# Patient Record
Sex: Female | Born: 1990 | Race: Black or African American | Hispanic: No | Marital: Single | Smoking: Never smoker
Health system: Southern US, Community
[De-identification: ages and names within clinical notes are randomized; demographics above are authoritative.]

## PROBLEM LIST (undated history)

## (undated) DIAGNOSIS — F419 Anxiety disorder, unspecified: Secondary | ICD-10-CM

## (undated) DIAGNOSIS — G43909 Migraine, unspecified, not intractable, without status migrainosus: Secondary | ICD-10-CM

## (undated) HISTORY — DX: Anxiety disorder, unspecified: F41.9

## (undated) HISTORY — DX: Migraine, unspecified, not intractable, without status migrainosus: G43.909

---
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
## 2011-03-07 ENCOUNTER — Emergency Department (HOSPITAL_COMMUNITY): Payer: No Typology Code available for payment source

## 2011-03-07 ENCOUNTER — Emergency Department (HOSPITAL_COMMUNITY)
Admission: EM | Admit: 2011-03-07 | Discharge: 2011-03-07 | Disposition: A | Discharge status: Home / Self Care | Payer: No Typology Code available for payment source | Attending: Emergency Medicine | Admitting: Emergency Medicine

## 2011-03-07 DIAGNOSIS — S93409A Sprain of unspecified ligament of unspecified ankle, initial encounter: Secondary | ICD-10-CM | POA: Insufficient documentation

## 2011-03-07 DIAGNOSIS — X58XXXA Exposure to other specified factors, initial encounter: Secondary | ICD-10-CM | POA: Insufficient documentation

## 2011-03-07 DIAGNOSIS — M549 Dorsalgia, unspecified: Secondary | ICD-10-CM | POA: Insufficient documentation

## 2011-03-07 DIAGNOSIS — Y9367 Activity, basketball: Secondary | ICD-10-CM | POA: Insufficient documentation

## 2011-04-07 LAB — D-DIMER, QUANTITATIVE: D-Dimer, Quant: 0.37 ug/mL-FEU (ref 0.00–0.48)

## 2011-08-18 ENCOUNTER — Other Ambulatory Visit: Payer: Self-pay | Admitting: Internal Medicine

## 2011-08-18 DIAGNOSIS — N6311 Unspecified lump in the right breast, upper outer quadrant: Secondary | ICD-10-CM

## 2011-08-20 ENCOUNTER — Ambulatory Visit
Admission: RE | Admit: 2011-08-20 | Discharge: 2011-08-20 | Disposition: A | Discharge status: Home / Self Care | Payer: BC Managed Care – PPO | Source: Ambulatory Visit | Attending: Internal Medicine | Admitting: Internal Medicine

## 2011-08-20 ENCOUNTER — Other Ambulatory Visit: Payer: Self-pay

## 2011-08-20 DIAGNOSIS — N6311 Unspecified lump in the right breast, upper outer quadrant: Secondary | ICD-10-CM

## 2011-08-21 ENCOUNTER — Ambulatory Visit
Admission: RE | Admit: 2011-08-21 | Discharge: 2011-08-21 | Disposition: A | Discharge status: Home / Self Care | Payer: BC Managed Care – PPO | Source: Ambulatory Visit | Attending: Internal Medicine | Admitting: Internal Medicine

## 2011-08-21 ENCOUNTER — Other Ambulatory Visit: Payer: Self-pay | Admitting: Internal Medicine

## 2011-08-21 DIAGNOSIS — R2230 Localized swelling, mass and lump, unspecified upper limb: Secondary | ICD-10-CM

## 2011-08-26 ENCOUNTER — Ambulatory Visit
Admission: RE | Admit: 2011-08-26 | Discharge: 2011-08-26 | Disposition: A | Discharge status: Home / Self Care | Payer: BC Managed Care – PPO | Source: Ambulatory Visit | Attending: Internal Medicine | Admitting: Internal Medicine

## 2011-08-26 DIAGNOSIS — R2230 Localized swelling, mass and lump, unspecified upper limb: Secondary | ICD-10-CM

## 2013-02-09 ENCOUNTER — Emergency Department (HOSPITAL_COMMUNITY)
Admission: EM | Admit: 2013-02-09 | Discharge: 2013-02-09 | Disposition: A | Discharge status: Home / Self Care | Payer: BC Managed Care – PPO | Attending: Emergency Medicine | Admitting: Emergency Medicine

## 2013-02-09 ENCOUNTER — Encounter (HOSPITAL_COMMUNITY): Payer: Self-pay | Admitting: *Deleted

## 2013-02-09 DIAGNOSIS — Z79899 Other long term (current) drug therapy: Secondary | ICD-10-CM | POA: Insufficient documentation

## 2013-02-09 DIAGNOSIS — Z7982 Long term (current) use of aspirin: Secondary | ICD-10-CM | POA: Insufficient documentation

## 2013-02-09 DIAGNOSIS — J029 Acute pharyngitis, unspecified: Secondary | ICD-10-CM | POA: Insufficient documentation

## 2013-02-09 DIAGNOSIS — R1310 Dysphagia, unspecified: Secondary | ICD-10-CM | POA: Insufficient documentation

## 2013-02-09 MED ORDER — DEXAMETHASONE SODIUM PHOSPHATE 4 MG/ML IJ SOLN: 8.0000 mg | Freq: Once | INTRAMUSCULAR | Status: DC

## 2013-02-09 MED ORDER — PENICILLIN G BENZATHINE 1200000 UNIT/2ML IM SUSP
1.2000 10*6.[IU] | Freq: Once | INTRAMUSCULAR | Status: AC
  Administered 2013-02-09: 1.2 10*6.[IU] via INTRAMUSCULAR
  Filled 2013-02-09: qty 2

## 2013-02-09 MED ORDER — DEXAMETHASONE SODIUM PHOSPHATE 10 MG/ML IJ SOLN
8.0000 mg | Freq: Once | INTRAMUSCULAR | Status: AC
  Administered 2013-02-09: 8 mg via INTRAMUSCULAR
  Filled 2013-02-09: qty 1

## 2013-02-09 MED ORDER — HYDROCODONE-ACETAMINOPHEN 7.5-500 MG/15ML PO SOLN: ORAL | Status: DC

## 2013-02-09 NOTE — ED Provider Notes (Signed)
History     CSN: [id number]  Arrival date & time 02/09/13  1511   First MD Initiated Contact with Patient 02/09/13 1537      Chief Complaint  Patient presents with  . Sore Throat  . Back Pain    (Consider location/radiation/quality/duration/timing/severity/associated sxs/prior treatment) HPI  Patient reports 4 days ago she started getting diffuse body aches and then started getting a sore throat. She states she feels like her tonsils are swollen. She states she's having difficulty swallowing and she feels like her airway is constricted. She denies any fever. She denies being around anybody else who is ill. Patient also states she has her typical migraine headache that is in the frontal area and is a pressure feeling. She states that headache is improving. She denies nausea, vomiting, blurred vision. She states she does have some mild light sensitivity. 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  PCP Dr. Kirtland Bouchard. [place]  History reviewed. No pertinent past medical history.  No past surgical history on file.  No family history on file.  History  Substance Use Topics  . Smoking status: no  . Smokeless tobacco: Not on file  . Alcohol Use: No  college student  OB History   Grav Para Term Preterm Abortions TAB SAB Ect Mult Living                  Review of Systems  All other systems reviewed and are negative.    Allergies  Review of patient's allergies indicates no known allergies.  Home Medications   Current Outpatient Rx  Name  Route  Sig  Dispense  Refill  . aspirin 325 MG tablet   Oral   Take 325 mg by mouth daily.         . cholecalciferol (VITAMIN D) 1000 UNITS tablet   Oral   Take 1,000 Units by mouth daily.         . ferrous gluconate (FERGON) 324 MG tablet   Oral   Take 324 mg by mouth daily with breakfast.         . ibuprofen (ADVIL,MOTRIN) 200 MG tablet   Oral   Take 400 mg by mouth every 6 (six) hours as needed for pain.            BP 142/75  Pulse 91  Temp(Src) 99.1 F (37.3 C) (Oral)  Resp 20  SpO2 100%  Vital signs normal    Physical Exam  Nursing note and vitals reviewed. Constitutional: She is oriented to person, place, and time. She appears well-developed and well-nourished.  Non-toxic appearance. She does not appear ill. No distress.  Coughing at times  HENT:  Head: Normocephalic and atraumatic.  Right Ear: External ear normal.  Left Ear: External ear normal.  Nose: Nose normal. No mucosal edema or rhinorrhea.  Mouth/Throat: Oropharynx is clear and moist and mucous membranes are normal. No dental abscesses or edematous.    Normal voice, has redenned tonsils bilaterally with purlent material. No soft tissue swelling, not "kissing tonsils". Pt handling her secretions, not spitting in a cup.   Eyes: Conjunctivae and EOM are normal. Pupils are equal, round, and reactive to light.  Neck: Normal range of motion and full passive range of motion without pain. Neck supple.  No supraclavicular or epitrochlear lymph nodes  Cardiovascular: Normal rate, regular rhythm and normal heart sounds.  Exam reveals no gallop and no friction rub.   No murmur heard. Pulmonary/Chest: Effort normal and breath sounds normal.  No respiratory distress. She has no wheezes. She has no rhonchi. She has no rales. She exhibits no tenderness and no crepitus.  Abdominal: Normal appearance.  Musculoskeletal: Normal range of motion. She exhibits no edema and no tenderness.  Moves all extremities well.   Lymphadenopathy:    She has no cervical adenopathy.  Neurological: She is alert and oriented to person, place, and time. She has normal strength. No cranial nerve deficit.  Skin: Skin is warm, dry and intact. No rash noted. No erythema. No pallor.  Psychiatric: She has a normal mood and affect. Her speech is normal and behavior is normal. Her mood appears not anxious.    ED Course  Procedures (including critical care  time)  Medications  penicillin g benzathine (BICILLIN LA) 1200000 UNIT/2ML injection 1.2 Million Units (1.2 Million Units Intramuscular Given 02/09/13 1724)  dexamethasone (DECADRON) injection 8 mg (8 mg Intramuscular Given 02/09/13 1725)    Patient not tested for mono because she has no lymphadenopathy. I feel on her exam she does have strep although the rapid strep test is negative.   Results for orders placed during the hospital encounter of 02/09/13  RAPID STREP SCREEN      Result Value Range   Streptococcus, Group A Screen (Direct) NEGATIVE  NEGATIVE      1. Sore throat     Plan discharge  Devoria Albe, MD, FACEP   MDM          Ward Givens, MD 02/09/13 [phone]

## 2013-02-09 NOTE — ED Notes (Signed)
Pt escorted to discharge window. Pt verbalized understanding discharge instructions. In no acute distress.  

## 2013-02-09 NOTE — ED Notes (Addendum)
Pt reports sore throat and feeling like "swollen throat" x4 days. Pain 10/10. Pt still has tonsils. Denies cough, n/v/d. Has not taken any medications for sore throat s/s.   Reports back pain, thigh area, achy pain x4 days as well.

## 2013-10-12 ENCOUNTER — Encounter (HOSPITAL_COMMUNITY): Payer: Self-pay | Admitting: Emergency Medicine

## 2013-10-12 ENCOUNTER — Emergency Department (HOSPITAL_COMMUNITY)
Admission: EM | Admit: 2013-10-12 | Discharge: 2013-10-12 | Disposition: A | Discharge status: Home / Self Care | Payer: BC Managed Care – PPO | Source: Home / Self Care | Attending: Family Medicine | Admitting: Family Medicine

## 2013-10-12 ENCOUNTER — Emergency Department (INDEPENDENT_AMBULATORY_CARE_PROVIDER_SITE_OTHER): Payer: BC Managed Care – PPO

## 2013-10-12 DIAGNOSIS — J111 Influenza due to unidentified influenza virus with other respiratory manifestations: Secondary | ICD-10-CM

## 2013-10-12 MED ORDER — DEXTROMETHORPHAN POLISTIREX 30 MG/5ML PO LQCR: 60.0000 mg | ORAL | Status: DC | PRN

## 2013-10-12 MED ORDER — IPRATROPIUM BROMIDE 0.06 % NA SOLN: 2.0000 | Freq: Four times a day (QID) | NASAL | Status: DC

## 2013-10-12 NOTE — ED Provider Notes (Signed)
CSN: [id number]     Arrival date & time 10/12/13  1753 History   First MD Initiated Contact with Patient 10/12/13 1807     Chief Complaint  Patient presents with  . URI   (Consider location/radiation/quality/duration/timing/severity/associated sxs/prior Treatment) Patient is a 22 y.o. female presenting with URI. The history is provided by the patient and a parent.  URI Presenting symptoms: congestion, cough, fever, rhinorrhea and sore throat   Severity:  Moderate Duration:  4 days Progression:  Unchanged Chronicity:  New Associated symptoms: myalgias   Associated symptoms: no wheezing   Risk factors: sick contacts   Risk factors: no recent illness   Risk factors comment:  Has not had flu vaccine this season.   History reviewed. No pertinent past medical history. History reviewed. No pertinent past surgical history. No family history on file. History  Substance Use Topics  . Smoking status: Never Smoker   . Smokeless tobacco: Not on file  . Alcohol Use: No   OB History   Grav Para Term Preterm Abortions TAB SAB Ect Mult Living                 Review of Systems  Constitutional: Positive for fever.  HENT: Positive for congestion, rhinorrhea and sore throat.   Respiratory: Positive for cough. Negative for wheezing.   Gastrointestinal: Negative.   Genitourinary: Negative.   Musculoskeletal: Positive for myalgias.    Allergies  Review of patient's allergies indicates no known allergies.  Home Medications   Current Outpatient Rx  Name  Route  Sig  Dispense  Refill  . aspirin 325 MG tablet   Oral   Take 325 mg by mouth daily.         . cholecalciferol (VITAMIN D) 1000 UNITS tablet   Oral   Take 1,000 Units by mouth daily.         Marland Kitchen dextromethorphan (DELSYM) 30 MG/5ML liquid   Oral   Take 10 mLs (60 mg total) by mouth as needed for cough.   89 mL   1   . ferrous gluconate (FERGON) 324 MG tablet   Oral   Take 324 mg by mouth daily with breakfast.          . HYDROcodone-acetaminophen (LORTAB) 7.5-500 MG/15ML solution      Take 2-3 tsp po (10-15 cc) every 6 hrs for pain   120 mL   0   . ibuprofen (ADVIL,MOTRIN) 200 MG tablet   Oral   Take 400 mg by mouth every 6 (six) hours as needed for pain.         Marland Kitchen ipratropium (ATROVENT) 0.06 % nasal spray   Nasal   Place 2 sprays into the nose 4 (four) times daily.   15 mL   1    BP 136/89  Pulse 101  Temp(Src) 99 F (37.2 C) (Oral)  Resp 16  SpO2 100%  LMP 09/03/2013 Physical Exam  Nursing note and vitals reviewed. Constitutional: She is oriented to person, place, and time. She appears well-developed and well-nourished.  HENT:  Head: Normocephalic.  Right Ear: External ear normal.  Left Ear: External ear normal.  Mouth/Throat: Oropharynx is clear and moist.  Eyes: Conjunctivae are normal. Pupils are equal, round, and reactive to light.  Neck: Normal range of motion. Neck supple.  Cardiovascular: Normal rate, regular rhythm and normal heart sounds.   Pulmonary/Chest: Effort normal and breath sounds normal.  Abdominal: Soft. Bowel sounds are normal. She exhibits no distension and no mass. There  is no tenderness. There is no rebound and no guarding.  Lymphadenopathy:    She has cervical adenopathy.  Neurological: She is alert and oriented to person, place, and time.  Skin: Skin is warm and dry.    ED Course  Procedures (including critical care time) Labs Review Labs Reviewed  POCT RAPID STREP A [facility] URG CARE ONLY)   Imaging Review Dg Chest 2 View  10/12/2013   CLINICAL DATA:  Fever, cough  EXAM: CHEST  2 VIEW  COMPARISON:  February 16, 2009.  FINDINGS: The heart size and mediastinal contours are within normal limits. Both lungs are clear. The visualized skeletal structures are unremarkable. There is scoliosis of spine.  IMPRESSION: No active cardiopulmonary disease.   Electronically Signed   By: Sherian Rein M.D.   On: 10/12/2013 18:38      MDM  Strep neg X-rays  reviewed and report per radiologist.     Linna Hoff, MD 10/12/13 1904

## 2013-10-12 NOTE — ED Notes (Addendum)
Pt c/o cold sxs onset 4 days Sxs include fevers, ST, chills, productive cough, congestin Taking OTC cold meds Denies: abd pain, urinary sxs Alert w/no signs of acute distress.

## 2013-10-15 LAB — CULTURE, GROUP A STREP

## 2014-12-20 ENCOUNTER — Emergency Department (HOSPITAL_COMMUNITY)
Admission: EM | Admit: 2014-12-20 | Discharge: 2014-12-20 | Disposition: A | Discharge status: Home / Self Care | Payer: BC Managed Care – PPO | Attending: Emergency Medicine | Admitting: Emergency Medicine

## 2014-12-20 ENCOUNTER — Emergency Department (HOSPITAL_COMMUNITY): Payer: BC Managed Care – PPO

## 2014-12-20 ENCOUNTER — Encounter (HOSPITAL_COMMUNITY): Payer: Self-pay | Admitting: *Deleted

## 2014-12-20 DIAGNOSIS — W208XXA Other cause of strike by thrown, projected or falling object, initial encounter: Secondary | ICD-10-CM | POA: Diagnosis not present

## 2014-12-20 DIAGNOSIS — Y9389 Activity, other specified: Secondary | ICD-10-CM | POA: Diagnosis not present

## 2014-12-20 DIAGNOSIS — M79642 Pain in left hand: Secondary | ICD-10-CM

## 2014-12-20 DIAGNOSIS — Y9289 Other specified places as the place of occurrence of the external cause: Secondary | ICD-10-CM | POA: Insufficient documentation

## 2014-12-20 DIAGNOSIS — S60222A Contusion of left hand, initial encounter: Secondary | ICD-10-CM | POA: Insufficient documentation

## 2014-12-20 DIAGNOSIS — Z79899 Other long term (current) drug therapy: Secondary | ICD-10-CM | POA: Insufficient documentation

## 2014-12-20 DIAGNOSIS — Z7982 Long term (current) use of aspirin: Secondary | ICD-10-CM | POA: Insufficient documentation

## 2014-12-20 DIAGNOSIS — S6992XA Unspecified injury of left wrist, hand and finger(s), initial encounter: Secondary | ICD-10-CM | POA: Diagnosis present

## 2014-12-20 DIAGNOSIS — Y998 Other external cause status: Secondary | ICD-10-CM | POA: Diagnosis not present

## 2014-12-20 MED ORDER — HYDROCODONE-ACETAMINOPHEN 5-325 MG PO TABS
1.0000 | ORAL_TABLET | Freq: Once | ORAL | Status: AC
  Administered 2014-12-20: 1 via ORAL
  Filled 2014-12-20: qty 1

## 2014-12-20 MED ORDER — IBUPROFEN 800 MG PO TABS: 800.0000 mg | ORAL_TABLET | Freq: Three times a day (TID) | ORAL | Status: DC

## 2014-12-20 MED ORDER — HYDROCODONE-ACETAMINOPHEN 5-325 MG PO TABS: 1.0000 | ORAL_TABLET | ORAL | Status: DC | PRN

## 2014-12-20 NOTE — ED Notes (Signed)
Pt complains of left hand pain. Pt states a TV set weighing ~50lbs tipped over and fell on pt's hand. Pt states the pain is 9/10.

## 2014-12-20 NOTE — ED Provider Notes (Signed)
CSN: [id number]     Arrival date & time 12/20/14  2030 History  This chart was scribed for non-physician practitioner, Elpidio AnisShari Milana Salay, PA-C,working with Suzi RootsKevin E Steinl, MD, by Karle PlumberJennifer Tensley, ED Scribe. This patient was seen in room WTR6/WTR6 and the patient's care was started at 9:17 PM.  Chief Complaint  Patient presents with  . Hand Injury   The history is provided by the patient. No language interpreter was used.    HPI Comments:  Mary C Catalina Simon is a 23 y.o. female who presents to the Emergency Department complaining of a left hand injury she sustained when a large television was dropped on it about one hour ago. She reports the television weighed about 50 pounds and she was helping to lift it from a trunk when it was dropped, catching it between the bottom of the trunk and the bottom of the television. She reports moderate pain to the 3rd and 4th digits and is unable to move them. Denies modifying factors. She has not done anything to treat her symptoms. Denies numbness, tingling, nausea or vomiting.   History reviewed. No pertinent past medical history. History reviewed. No pertinent past surgical history. No family history on file. History  Substance Use Topics  . Smoking status: Never Smoker   . Smokeless tobacco: Not on file  . Alcohol Use: No   OB History    No data available     Review of Systems  Gastrointestinal: Negative for nausea and vomiting.  Musculoskeletal: Positive for arthralgias.  Skin: Negative for wound.  Neurological: Negative for numbness.  All other systems reviewed and are negative.   Allergies  Review of patient's allergies indicates no known allergies.  Home Medications   Prior to Admission medications   Medication Sig Start Date End Date Taking? Authorizing Provider  aspirin 325 MG tablet Take 325 mg by mouth daily.    Historical Provider, MD  cholecalciferol (VITAMIN D) 1000 UNITS tablet Take 1,000 Units by mouth daily.    Historical  Provider, MD  dextromethorphan (DELSYM) 30 MG/5ML liquid Take 10 mLs (60 mg total) by mouth as needed for cough. 10/12/13   Linna HoffJames D Kindl, MD  ferrous gluconate (FERGON) 324 MG tablet Take 324 mg by mouth daily with breakfast.    Historical Provider, MD  HYDROcodone-acetaminophen (LORTAB) 7.5-500 MG/15ML solution Take 2-3 tsp po (10-15 cc) every 6 hrs for pain 02/09/13   Ward GivensIva L Knapp, MD  ibuprofen (ADVIL,MOTRIN) 200 MG tablet Take 400 mg by mouth every 6 (six) hours as needed for pain.    Historical Provider, MD  ipratropium (ATROVENT) 0.06 % nasal spray Place 2 sprays into the nose 4 (four) times daily. 10/12/13   Linna HoffJames D Kindl, MD   Triage Vitals: BP 109/49 mmHg  Pulse 66  Temp(Src) 98.3 F (36.8 C) (Oral)  Resp 18  SpO2 100%  LMP 11/07/2014 Physical Exam  Constitutional: She is oriented to person, place, and time. She appears well-developed and well-nourished.  HENT:  Head: Normocephalic and atraumatic.  Eyes: EOM are normal.  Neck: Normal range of motion.  Cardiovascular: Normal rate.   Pulmonary/Chest: Effort normal.  Musculoskeletal: Normal range of motion. She exhibits tenderness.  Left hand unremarkable on appearance. No significant swelling. No wounds. Tender most to third and fourth digits with limited DIP ROM.  Neurological: She is alert and oriented to person, place, and time.  Skin: Skin is warm and dry.  Psychiatric: She has a normal mood and affect. Her behavior is normal.  Nursing note and vitals reviewed.   ED Course  Procedures (including critical care time) DIAGNOSTIC STUDIES: Oxygen Saturation is 100% on RA, normal by my interpretation.   COORDINATION OF CARE: 9:19 PM- Will X-Ray left hand. Offered pain medication but pt declined because she has not eaten. Will give crackers and check back with pt. Pt verbalizes understanding and agrees to plan.  Medications  HYDROcodone-acetaminophen (NORCO/VICODIN) 5-325 MG per tablet 1 tablet (1 tablet Oral Given 12/20/14  2141)    Labs Review Labs Reviewed - No data to display  Imaging Review Dg Hand Complete Left  12/20/2014   CLINICAL DATA:  Left hand pain and numbness after trauma. Initial encounter.  EXAM: LEFT HAND - COMPLETE 3+ VIEW  COMPARISON:  None.  FINDINGS: There is no evidence of fracture or dislocation. No acute soft tissue findings.  IMPRESSION: Negative.   Electronically Signed   By: Tiburcio PeaJonathan  Watts M.D.   On: 12/20/2014 21:45     EKG Interpretation None      MDM   Final diagnoses:  Hand pain, left    1. Contusion injury, left hand  No fracture on imaging of left hand. Limited flexion/extension left 3rd/4th DIP likely due to pain as opposed to tendon injury. Will refer to hand ortho on as needed basis. Pain management provided.  I personally performed the services described in this documentation, which was scribed in my presence. The recorded information has been reviewed and is accurate.    Arnoldo HookerShari A Rachael Zapanta, PA-C 12/20/14 2154  Suzi RootsKevin E Steinl, MD 12/20/14 2209

## 2014-12-20 NOTE — Discharge Instructions (Signed)
Cryotherapy °Cryotherapy means treatment with cold. Ice or gel packs can be used to reduce both pain and swelling. Ice is the most helpful within the first 24 to 48 hours after an injury or flare-up from overusing a muscle or joint. Sprains, strains, spasms, burning pain, shooting pain, and aches can all be eased with ice. Ice can also be used when recovering from surgery. Ice is effective, has very few side effects, and is safe for most people to use. °PRECAUTIONS  °Ice is not a safe treatment option for people with: °· Raynaud phenomenon. This is a condition affecting small blood vessels in the extremities. Exposure to cold may cause your problems to return. °· Cold hypersensitivity. There are many forms of cold hypersensitivity, including: °¨ Cold urticaria. Red, itchy hives appear on the skin when the tissues begin to warm after being iced. °¨ Cold erythema. This is a red, itchy rash caused by exposure to cold. °¨ Cold hemoglobinuria. Red blood cells break down when the tissues begin to warm after being iced. The hemoglobin that carry oxygen are passed into the urine because they cannot combine with blood proteins fast enough. °· Numbness or altered sensitivity in the area being iced. °If you have any of the following conditions, do not use ice until you have discussed cryotherapy with your caregiver: °· Heart conditions, such as arrhythmia, angina, or chronic heart disease. °· High blood pressure. °· Healing wounds or open skin in the area being iced. °· Current infections. °· Rheumatoid arthritis. °· Poor circulation. °· Diabetes. °Ice slows the blood flow in the region it is applied. This is beneficial when trying to stop inflamed tissues from spreading irritating chemicals to surrounding tissues. However, if you expose your skin to cold temperatures for too long or without the proper protection, you can damage your skin or nerves. Watch for signs of skin damage due to cold. °HOME CARE INSTRUCTIONS °Follow  these tips to use ice and cold packs safely. °· Place a dry or damp towel between the ice and skin. A damp towel will cool the skin more quickly, so you may need to shorten the time that the ice is used. °· For a more rapid response, add gentle compression to the ice. °· Ice for no more than 10 to 20 minutes at a time. The bonier the area you are icing, the less time it will take to get the benefits of ice. °· Check your skin after 5 minutes to make sure there are no signs of a poor response to cold or skin damage. °· Rest 20 minutes or more between uses. °· Once your skin is numb, you can end your treatment. You can test numbness by very lightly touching your skin. The touch should be so light that you do not see the skin dimple from the pressure of your fingertip. When using ice, most people will feel these normal sensations in this order: cold, burning, aching, and numbness. °· Do not use ice on someone who cannot communicate their responses to pain, such as small children or people with dementia. °HOW TO MAKE AN ICE PACK °Ice packs are the most common way to use ice therapy. Other methods include ice massage, ice baths, and cryosprays. Muscle creams that cause a cold, tingly feeling do not offer the same benefits that ice offers and should not be used as a substitute unless recommended by your caregiver. °To make an ice pack, do one of the following: °· Place crushed ice or a   bag of frozen vegetables in a sealable plastic bag. Squeeze out the excess air. Place this bag inside another plastic bag. Slide the bag into a pillowcase or place a damp towel between your skin and the bag. °· Mix 3 parts water with 1 part rubbing alcohol. Freeze the mixture in a sealable plastic bag. When you remove the mixture from the freezer, it will be slushy. Squeeze out the excess air. Place this bag inside another plastic bag. Slide the bag into a pillowcase or place a damp towel between your skin and the bag. °SEEK MEDICAL CARE  IF: °· You develop white spots on your skin. This may give the skin a blotchy (mottled) appearance. °· Your skin turns blue or pale. °· Your skin becomes waxy or hard. °· Your swelling gets worse. °MAKE SURE YOU:  °· Understand these instructions. °· Will watch your condition. °· Will get help right away if you are not doing well or get worse. °Document Released: 08/03/2011 Document Revised: 04/23/2014 Document Reviewed: 08/03/2011 °[employer]® Patient Information ©2015 [employer]. This information is not intended to replace advice given to you by your health care provider. Make sure you discuss any questions you have with your health care provider. ° °

## 2017-10-11 ENCOUNTER — Ambulatory Visit (INDEPENDENT_AMBULATORY_CARE_PROVIDER_SITE_OTHER): Payer: Self-pay | Admitting: Physician Assistant

## 2018-11-16 ENCOUNTER — Emergency Department (HOSPITAL_COMMUNITY): Payer: BLUE CROSS/BLUE SHIELD

## 2018-11-16 ENCOUNTER — Encounter (HOSPITAL_COMMUNITY): Payer: Self-pay | Admitting: Obstetrics and Gynecology

## 2018-11-16 ENCOUNTER — Other Ambulatory Visit: Payer: Self-pay

## 2018-11-16 ENCOUNTER — Emergency Department (HOSPITAL_COMMUNITY)
Admission: EM | Admit: 2018-11-16 | Discharge: 2018-11-16 | Disposition: A | Discharge status: Home / Self Care | Payer: BLUE CROSS/BLUE SHIELD | Attending: Emergency Medicine | Admitting: Emergency Medicine

## 2018-11-16 DIAGNOSIS — R21 Rash and other nonspecific skin eruption: Secondary | ICD-10-CM

## 2018-11-16 DIAGNOSIS — Z79899 Other long term (current) drug therapy: Secondary | ICD-10-CM | POA: Insufficient documentation

## 2018-11-16 DIAGNOSIS — R519 Headache, unspecified: Secondary | ICD-10-CM

## 2018-11-16 DIAGNOSIS — H538 Other visual disturbances: Secondary | ICD-10-CM | POA: Diagnosis not present

## 2018-11-16 DIAGNOSIS — R51 Headache: Secondary | ICD-10-CM | POA: Diagnosis not present

## 2018-11-16 DIAGNOSIS — R0789 Other chest pain: Secondary | ICD-10-CM | POA: Diagnosis not present

## 2018-11-16 DIAGNOSIS — R202 Paresthesia of skin: Secondary | ICD-10-CM | POA: Diagnosis present

## 2018-11-16 LAB — CBC
HCT: 40.9 % (ref 36.0–46.0)
HEMOGLOBIN: 13 g/dL (ref 12.0–15.0)
MCH: 28.4 pg (ref 26.0–34.0)
MCHC: 31.8 g/dL (ref 30.0–36.0)
MCV: 89.3 fL (ref 80.0–100.0)
Platelets: 355 10*3/uL (ref 150–400)
RBC: 4.58 MIL/uL (ref 3.87–5.11)
RDW: 13.9 % (ref 11.5–15.5)
WBC: 8.7 10*3/uL (ref 4.0–10.5)
nRBC: 0 % (ref 0.0–0.2)

## 2018-11-16 LAB — BASIC METABOLIC PANEL
ANION GAP: 7 (ref 5–15)
BUN: 9 mg/dL (ref 6–20)
CO2: 28 mmol/L (ref 22–32)
Calcium: 9.1 mg/dL (ref 8.9–10.3)
Chloride: 102 mmol/L (ref 98–111)
Creatinine, Ser: 0.63 mg/dL (ref 0.44–1.00)
GFR calc Af Amer: >60 mL/min (ref >60)
GLUCOSE: 94 mg/dL (ref 70–99)
POTASSIUM: 3.8 mmol/L (ref 3.5–5.1)
Sodium: 137 mmol/L (ref 135–145)

## 2018-11-16 LAB — I-STAT TROPONIN, ED: TROPONIN I, POC: 0 ng/mL (ref 0.00–0.08)

## 2018-11-16 LAB — I-STAT BETA HCG BLOOD, ED (MC, WL, AP ONLY): I-stat hCG, quantitative: <5 m[IU]/mL (ref <5)

## 2018-11-16 LAB — SEDIMENTATION RATE: SED RATE: 20 mm/h (ref 0–22)

## 2018-11-16 LAB — C-REACTIVE PROTEIN: CRP: 1.4 mg/dL — ABNORMAL HIGH (ref <1.0)

## 2018-11-16 MED ORDER — TETRACAINE HCL 0.5 % OP SOLN
1.0000 [drp] | Freq: Once | OPHTHALMIC | Status: AC
  Administered 2018-11-16: 1 [drp] via OPHTHALMIC
  Filled 2018-11-16: qty 4

## 2018-11-16 MED ORDER — LORAZEPAM 2 MG/ML IJ SOLN
1.0000 mg | Freq: Once | INTRAMUSCULAR | Status: AC
  Administered 2018-11-16: 1 mg via INTRAVENOUS
  Filled 2018-11-16: qty 1

## 2018-11-16 NOTE — ED Triage Notes (Signed)
Pt reports she was recently seen at the ER in [place] for chest pain. Pt reports the chest pain has been going on for approximately 6-8 weeks. Pt reports she had a positive d-dimer but negative CTA.  Pt reports she is having tingling in her left arm and leg.

## 2018-11-16 NOTE — ED Provider Notes (Signed)
[facility]-EMERGENCY DEPT Provider Note   CSN: [id number] Arrival date & time: 11/16/18  1804     History   Chief Complaint Chief Complaint  Patient presents with  . Chest Pain  . Tingling    HPI Davinity C Dettmann is a 27 y.o. female with no pertinent PMH who presents to the emergency department with multiple complaints.  She reports paresthesias and numbness in her bilateral hands and feet that radiates up the arms and legs for the last 6 to 8 weeks.  He also has felt as if her right arm and leg have felt somewhat heavier over the last week.  She also notes intermittent bilateral blurred vision and a right-sided headache that she describes as pressure-like.  States she has been treating her headache with Excedrin Migraine with complete resolution of her symptoms.  She states she has also been feeling pain that she describes as a warm sensation in her chest and on the right side of her neck.  She states the chest pain is worse when she is sitting and somewhat improved with standing.  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  She has a family history of antiphospholipid syndrome and lupus and she also takes estrogen containing OCPs.  A d-dimer was ordered, which was positive, so a PE study was performed, which was negative.  She reports she has since followed up with her primary care provider, Dr. Renae Gloss and was seen by optometry.  She states when she followed up with optometry she was told the pressure in her eye was elevated and she could be at risk for early glaucoma.  She is scheduled to follow-up again in the next few weeks.  She wears both contacts and glasses.  She also states she has developed some "formations" on her left leg.  He states there is an area of swelling on her left lower leg, posterior knee, and to the skin of her left  hip.  She denies fever, chills, loss of vision, diplopia, neck stiffness, back pain, vomiting, diarrhea, constipation, GU symptoms, or URI symptoms.  LMP was 10/21/2017.  The history is provided by the patient and a relative. No language interpreter was used.    History reviewed. No pertinent past medical history.  There are no active problems to display for this patient.   History reviewed. No pertinent surgical history.   OB History    Gravida      Para      Term      Preterm      AB      Living  0     SAB      TAB      Ectopic      Multiple      Live Births               Home Medications    Prior to Admission medications   Medication Sig Start Date End Date Taking? Authorizing Provider  Aspirin-Acetaminophen-Caffeine (EXCEDRIN MIGRAINE PO) Take 1-2 tablets by mouth daily as needed (pain).   Yes [provider]  busPIRone (BUSPAR) 5 MG tablet Take 5 mg by mouth 2 (two) times daily. 10/19/18  Yes [provider]  cholecalciferol (VITAMIN D) 1000 UNITS tablet Take 1,000 Units by mouth daily.   Yes [provider]  ferrous gluconate (FERGON) 324 MG  tablet Take 324 mg by mouth daily with breakfast.   Yes [provider]  naproxen sodium (ALEVE) 220 MG tablet Take 220-440 mg by mouth daily as needed (pain).   Yes [provider]  TRI-PREVIFEM 0.18/0.215/0.25 MG-35 MCG tablet Take 1 tablet by mouth daily. 08/30/18  Yes [provider]    Family History No family history on file.  Social History Social History   Tobacco Use  . Smoking status: Never Smoker  . Smokeless tobacco: Never Used  Substance Use Topics  . Alcohol use: Yes    Comment: Social  . Drug use: No     Allergies   Patient has no known allergies.   Review of Systems Review of Systems  Constitutional: Negative for activity change, chills and fever.  HENT: Negative for congestion, sinus pressure, sinus pain and sneezing.    Respiratory: Positive for shortness of breath. Negative for apnea, cough, wheezing and stridor.   Cardiovascular: Positive for chest pain. Negative for palpitations and leg swelling.  Gastrointestinal: Negative for abdominal pain, diarrhea, nausea and vomiting.  Genitourinary: Positive for menstrual problem. Negative for dysuria, vaginal bleeding and vaginal discharge.  Musculoskeletal: Positive for neck pain. Negative for arthralgias, back pain, gait problem, joint swelling, myalgias and neck stiffness.  Skin: Positive for rash. Negative for wound.  Allergic/Immunologic: Negative for immunocompromised state.  Neurological: Positive for weakness, numbness and headaches. Negative for dizziness, seizures, syncope, speech difficulty and light-headedness.  Hematological: Negative for adenopathy.  Psychiatric/Behavioral: Negative for confusion. The patient is nervous/anxious.    Physical Exam Updated Vital Signs BP 118/71   Pulse 87   Temp 99 F (37.2 C) (Oral)   Resp 20   Ht 5\' 6"  (1.676 m)   Wt 86.2 kg   LMP 10/22/2018 (Approximate)   SpO2 99%   BMI 30.67 kg/m   Physical Exam  Constitutional: She is oriented to person, place, and time. No distress.  Well-appearing.  No acute distress.  HENT:  Head: Normocephalic.  Eyes: Pupils are equal, round, and reactive to light. Conjunctivae and EOM are normal.  Questionably minimal right exophthalmos.  No ptosis.  Extraocular movements are intact.  Neck: Neck supple.  Cardiovascular: Normal rate, regular rhythm, normal heart sounds and intact distal pulses. Exam reveals no gallop and no friction rub.  No murmur heard. Pulmonary/Chest: Effort normal. No stridor. No respiratory distress. She has no wheezes. She has no rales. She exhibits no tenderness.  Abdominal: Soft. She exhibits no distension and no mass. There is no tenderness. There is no rebound and no guarding. No hernia.  Musculoskeletal:  There is a circular area of mild, localized  swelling to the left lateral hip, to the left posterior knee, and to the left lower leg.  No overlying erythema or warmth.  No palpable nodule.  No calf tenderness bilaterally.  Neurological: She is alert and oriented to person, place, and time.  Alert and oriented x4.  No clonus bilaterally.  No cogwheeling.  Finger-to-nose is intact bilaterally.  5 out of 5 strength against resistance of the bilateral upper and lower extremities.  No pronator drift.  Patient reports subjective sensation to light touch is decreased on the right lower extremity as compared to the left and right upper extremity as compared to the left.  Skin: Skin is warm. No rash noted. She is not diaphoretic.  Psychiatric: Her behavior is normal.  Nursing note and vitals reviewed.    ED Treatments / Results  Labs (all labs ordered are listed,  but only abnormal results are displayed) Labs Reviewed  C-REACTIVE PROTEIN - Abnormal; Notable for the following components:      Result Value   CRP 1.4 (*)    All other components within normal limits  BASIC METABOLIC PANEL  CBC  SEDIMENTATION RATE  I-STAT TROPONIN, ED  I-STAT BETA HCG BLOOD, ED ([facility], [facility], [facility] ONLY)    EKG EKG Interpretation  Date/Time:  Wednesday November 16 2018 18:15:35 EST Ventricular Rate:  82 PR Interval:    QRS Duration: 94 QT Interval:  375 QTC Calculation: 438 R Axis:   81 Text Interpretation:  Sinus rhythm Probable left atrial enlargement No significant change since last tracing Confirmed by Linwood Dibbles [phone]) on 11/16/2018 6:57:16 PM   Radiology Dg Chest 2 View  Result Date: 11/16/2018 CLINICAL DATA:  Chest pain for 2 months.  LEFT extremity tingling. EXAM: CHEST - 2 VIEW COMPARISON:  Chest radiograph October 12, 2013 FINDINGS: Cardiomediastinal silhouette is normal. No pleural effusions or focal consolidations. Trachea projects midline and there is no pneumothorax. Soft tissue planes and included osseous structures are non-suspicious.  IMPRESSION: Normal chest. Electronically Signed   By: Awilda Metro M.D.   On: 11/16/2018 18:43   Mr Brain Wo Contrast  Result Date: 11/16/2018 CLINICAL DATA:  Initial evaluation for chronic headache, normal neurologic exam. EXAM: MRI HEAD WITHOUT CONTRAST TECHNIQUE: Multiplanar, multiecho pulse sequences of the brain and surrounding structures were obtained without intravenous contrast. COMPARISON:  None available. FINDINGS: Brain: Cerebral volume within normal limits for patient age. No focal parenchymal signal abnormality identified. No abnormal foci of restricted diffusion to suggest acute or subacute ischemia. Gray-white matter differentiation well maintained. No encephalomalacia to suggest chronic infarction. No foci of susceptibility artifact to suggest acute or chronic intracranial hemorrhage. Mass lesion, midline shift or mass effect. No hydrocephalus. No extra-axial fluid collection. Major dural sinuses are grossly patent. Pituitary gland and suprasellar region are normal. Midline structures intact and normal. Vascular: Major intracranial vascular flow voids well maintained and normal in appearance. Skull and upper cervical spine: Craniocervical junction normal. Visualized upper cervical spine within normal limits. Bone marrow signal intensity normal. No scalp soft tissue abnormality. Sinuses/Orbits: Globes and orbital soft tissues within normal limits. Paranasal sinuses are clear. No mastoid effusion. Inner ear structures normal. Other: None. IMPRESSION: Normal brain MRI.  No acute intracranial abnormality identified. Electronically Signed   By: Rise Mu M.D.   On: 11/16/2018 21:56    Procedures Procedures (including critical care time)  Medications Ordered in ED Medications  LORazepam (ATIVAN) injection 1 mg (1 mg Intravenous Given 11/16/18 2048)  tetracaine (PONTOCAINE) 0.5 % ophthalmic solution 1 drop (1 drop Both Eyes Given by Other 11/16/18 2042)     Initial  Impression / Assessment and Plan / ED Course  I have reviewed the triage vital signs and the nursing notes.  Pertinent labs & imaging results that were available during my care of the patient were reviewed by me and considered in my medical decision making (see chart for details).     27 year old female with no pertinent past medical history presenting with multiple complaints that started approximately 6 to 8 weeks ago including stocking glove distribution of paresthesias and numbness, right-sided neck and chest pain, headaches, blurred vision, and right extremity heaviness.  She recently underwent a negative work-up for PE at the ER in [place].  Her symptoms today are not consistent with PE or DVT.  She will acuity is 20/20 bilaterally, 20/30 on the right, and 20/20  on the left.  She declines IOP testing she is wearing contacts at this time.  EKG unchanged from previous.  Chest x-ray is unremarkable.  Labs are notable for CRP of 1.4, but are otherwise normal.  Given the patient's symptoms and her age and family history of autoimmune disease, will order MRI brain to assess for MS.  MRI brain is unremarkable.  Doubt ACS, PE, DVT, gout, septic joint, septic emboli, MS, or infectious etiology at this time.   At this time, I am uncertain of the etiology of patient's symptoms.  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  I had a lengthy discussion with both the patient and her mother.  I can understand their frustration with the length of time her symptoms have been ongoing without any obvious answers.  I have discussed that some additional tests may be warranted in an outpatient setting, but are not indicated in the ED.  I have strongly recommended the patient follow-up with her PCP, Dr. Renae GlossShelton, as she has been established with her for some time.  The patient was  also requested a referral to rheumatology, which has been provided.  Strict return precautions given.  Patient is hemodynamically stable and in no acute distress.  She is safe for discharge home with outpatient follow-up at this time.  Final Clinical Impressions(s) / ED Diagnoses   Final diagnoses:  Paresthesias  Nonintractable headache, unspecified chronicity pattern, unspecified headache type  Atypical chest pain  Rash and nonspecific skin eruption    ED Discharge Orders    None       Barkley BoardsMcDonald, Vallorie Niccoli A, PA-C 11/17/18 0226    Linwood DibblesKnapp, Jon, MD 11/21/18 1626

## 2018-11-16 NOTE — Discharge Instructions (Signed)
Thank you for allowing me to care for you today in the Emergency Department.   You can continue to use Excedrin Migraine for your headaches as needed.  Use caution with using this medication because it can cause rebound headaches.  Call and schedule follow-up appointment with rheumatology.  Return to the emergency department if you develop respiratory distress, loss of one vision in one or both eyes, double vision, the worst headache of your life, persistent vomiting, or other new, concerning symptoms.

## 2018-11-16 NOTE — ED Notes (Signed)
Patient transported to MRI 

## 2018-11-28 NOTE — Progress Notes (Signed)
Office Visit Note  Patient: Mary Simon             Date of Birth: 12/02/1991           MRN: [id number]             PCP: Andi DevonShelton, Kimberly, MD Referring: Andi DevonShelton, Kimberly, MD Visit Date: 12/02/2018 Occupation: [name], part-time makes [employer]  Subjective:  New Patient (Initial Visit) (Abnormal labs, pain, weakness)   History of Present Illness: Mary Simon is a 27 y.o. female in consultation per request of her PCP.  According to patient her symptoms are started over of 2018 with numbness in her hands.  She states in the last 3 months she has been experiencing increased numbness and tingling in her hands and her feet.  She also has experienced left lower extremity weakness especially in the morning.  She was seen in the emergency room for evaluation and according to patient she had neurological examination which was normal and she was discharged home.  She has been also experiencing increased migraines and pressure behind her eyes.  She states that the numbness and tingling is all the time and sometimes gets more intense.  She is also had episodes of chest pain and stomach pain for which she was seen in the emergency room where the cardiology work-up was negative.  She describes some discomfort behind her knees more prominent behind her left knee joint.  She states that she has very sensitive skin since she feels her skin is painful.  She denies any muscle pain.  She has intermittent discomfort in her neck and lower back.  Activities of Daily Living:  Patient reports morning stiffness for 1 hour.   Patient Reports nocturnal pain.  Difficulty dressing/grooming: Reports Difficulty climbing stairs: Denies Difficulty getting out of chair: Denies Difficulty using hands for taps, buttons, cutlery, and/or writing: Reports  Review of Systems  Constitutional: Positive for fatigue. Negative for night sweats, weight gain and weight loss.  HENT: Positive for  mouth dryness. Negative for mouth sores, trouble swallowing, trouble swallowing and nose dryness.   Eyes: Positive for dryness. Negative for pain, redness and visual disturbance.  Respiratory: Negative for cough, shortness of breath and difficulty breathing.   Cardiovascular: Negative for chest pain, palpitations, hypertension, irregular heartbeat and swelling in legs/feet.  Gastrointestinal: Negative for blood in stool, constipation and diarrhea.  Endocrine: Negative for increased urination.  Genitourinary: Negative for difficulty urinating and vaginal dryness.  Musculoskeletal: Positive for arthralgias, gait problem, joint pain, morning stiffness and muscle tenderness. Negative for joint swelling, myalgias, muscle weakness and myalgias.  Skin: Negative for color change, rash, hair loss, skin tightness, ulcers and sensitivity to sunlight.  Allergic/Immunologic: Negative for susceptible to infections.  Neurological: Positive for numbness and weakness. Negative for dizziness, memory loss and night sweats.  Hematological: Negative for bruising/bleeding tendency and swollen glands.  Psychiatric/Behavioral: Positive for sleep disturbance. Negative for depressed mood. The patient is not nervous/anxious.     PMFS History:  Patient Active Problem List   Diagnosis Date Noted  . Vitamin D deficiency 12/02/2018  . Hx of migraines 12/02/2018    History reviewed. No pertinent past medical history.  Family History  Problem Relation Age of Onset  . Diabetes Mother   . Diabetes Father   . Stroke Father    History reviewed. No pertinent surgical history. Social History   Social History Narrative  . Not on file    Objective: Vital Signs:  BP 121/77 (BP Location: Right Arm, Patient Position: Sitting, Cuff Size: Normal)   Pulse 61   Resp 14   Ht 5' 6.25" (1.683 m)   Wt 198 lb 3.2 oz (89.9 kg)   LMP 11/25/2018   BMI 31.75 kg/m    Physical Exam Vitals signs and nursing note reviewed.    Constitutional:      Appearance: She is well-developed.  HENT:     Head: Normocephalic and atraumatic.  Eyes:     Conjunctiva/sclera: Conjunctivae normal.  Neck:     Musculoskeletal: Normal range of motion.  Cardiovascular:     Rate and Rhythm: Normal rate and regular rhythm.     Heart sounds: Normal heart sounds.  Pulmonary:     Effort: Pulmonary effort is normal.     Breath sounds: Normal breath sounds.  Abdominal:     General: Bowel sounds are normal.     Palpations: Abdomen is soft.  Lymphadenopathy:     Cervical: No cervical adenopathy.  Skin:    General: Skin is warm and dry.     Capillary Refill: Capillary refill takes less than 2 seconds.  Neurological:     Mental Status: She is alert and oriented to person, place, and time.  Psychiatric:        Behavior: Behavior normal.      Musculoskeletal Exam: She has discomfort range of motion of cervical and lumbar spine.  She had good range of motion in her cervical lumbar spine.  Shoulder joints, elbow joints, wrist joints, MCPs PIPs and DIPs been good range of motion with no synovitis.  Hip joints knee joints ankles MTPs PIPs were in good range of motion.  A small popliteal cyst was noted behind the left knee.  Other joints are in good range of motion with no synovitis.  CDAI Exam: CDAI Score: Not documented Patient Global Assessment: Not documented; Provider Global Assessment: Not documented Swollen: Not documented; Tender: Not documented Joint Exam   Not documented   There is currently no information documented on the homunculus. Go to the Rheumatology activity and complete the homunculus joint exam.  Investigation: Findings:  11/16/18: sed rate 20, CRP 1.4  Component     Latest Ref Rng & Units 11/16/2018  Sed Rate     0 - 22 mm/hr 20  CRP     <1.0 mg/dL 1.4 (H)   Imaging: Dg Chest 2 View  Result Date: 11/16/2018 CLINICAL DATA:  Chest pain for 2 months.  LEFT extremity tingling. EXAM: CHEST - 2 VIEW  COMPARISON:  Chest radiograph October 12, 2013 FINDINGS: Cardiomediastinal silhouette is normal. No pleural effusions or focal consolidations. Trachea projects midline and there is no pneumothorax. Soft tissue planes and included osseous structures are non-suspicious. IMPRESSION: Normal chest. Electronically Signed   By: Awilda Metro M.D.   On: 11/16/2018 18:43   Mr Brain Wo Contrast  Result Date: 11/16/2018 CLINICAL DATA:  Initial evaluation for chronic headache, normal neurologic exam. EXAM: MRI HEAD WITHOUT CONTRAST TECHNIQUE: Multiplanar, multiecho pulse sequences of the brain and surrounding structures were obtained without intravenous contrast. COMPARISON:  None available. FINDINGS: Brain: Cerebral volume within normal limits for patient age. No focal parenchymal signal abnormality identified. No abnormal foci of restricted diffusion to suggest acute or subacute ischemia. Gray-white matter differentiation well maintained. No encephalomalacia to suggest chronic infarction. No foci of susceptibility artifact to suggest acute or chronic intracranial hemorrhage. Mass lesion, midline shift or mass effect. No hydrocephalus. No extra-axial fluid collection. Major dural sinuses  are grossly patent. Pituitary gland and suprasellar region are normal. Midline structures intact and normal. Vascular: Major intracranial vascular flow voids well maintained and normal in appearance. Skull and upper cervical spine: Craniocervical junction normal. Visualized upper cervical spine within normal limits. Bone marrow signal intensity normal. No scalp soft tissue abnormality. Sinuses/Orbits: Globes and orbital soft tissues within normal limits. Paranasal sinuses are clear. No mastoid effusion. Inner ear structures normal. Other: None. IMPRESSION: Normal brain MRI.  No acute intracranial abnormality identified. Electronically Signed   By: Rise Mu M.D.   On: 11/16/2018 21:56   Xr Cervical Spine 2 Or 3  Views  Result Date: 12/02/2018 No disc space narrowing was noted.  No facet joint arthropathy was noted. Impression: Unremarkable x-ray of the cervical spine.  Xr Knee 3 View Left  Result Date: 12/02/2018 No patellofemoral narrowing was noted.  No medial lateral compartment narrowing was noted.  No chondrocalcinosis was noted. Impression: Unremarkable x-ray of the knee joint.  Xr Lumbar Spine 2-3 Views  Result Date: 12/02/2018 Lumbar levoscoliosis was noted.  Exaggerated lumbar lordosis was noted.  No significant disc space narrowing was noted. Impression: These findings are consistent with a lumbar levoscoliosis.   Recent Labs: Lab Results  Component Value Date   WBC 8.7 11/16/2018   HGB 13.0 11/16/2018   PLT 355 11/16/2018   NA 137 11/16/2018   K 3.8 11/16/2018   CL 102 11/16/2018   CO2 28 11/16/2018   GLUCOSE 94 11/16/2018   BUN 9 11/16/2018   CREATININE 0.63 11/16/2018   CALCIUM 9.1 11/16/2018   GFRAA >60 11/16/2018    Speciality Comments: No specialty comments available.  Procedures:  No procedures performed Allergies: Patient has no known allergies.   Assessment / Plan:     Visit Diagnoses: Paresthesia -patient complains of paresthesias for the last 1 year.  She describes the paresthesias are getting worse in the last few months in both upper and lower extremities.  Plan: COMPLETE METABOLIC PANEL WITH GFR, B12 and Folate Panel  Neck pain -she has been experiencing neck pain and stiffness.  Plan: XR Cervical Spine 2 or 3 views.  The cervical spine x-rays were unremarkable.  A handout on cervical spine exercises was given.  Chronic midline low back pain without sciatica -she has chronic lower back pain without radiculopathy.  Plan: XR Lumbar Spine 2-3 Views.  The lumbar spine x-ray showed lumbar scoliosis.  I have given her a handout on back exercises.  Chronic pain of left knee -she complains of left lower extremity pain and she has noticed a knot behind her knee  which is a palpable Baker's cyst.  Plan: XR KNEE 3 VIEW LEFT.  The knee joint x-ray was unremarkable.  I have given her handout on knee exercises.  I plan to inject her knee at the next visit.  Synovial cyst of left popliteal space - Plan: Rheumatoid factor, Angiotensin converting enzyme, Cyclic citrul peptide antibody, IgG, ANA  Other fatigue - Plan: Urinalysis, Routine w reflex microscopic, CK, TSH, Serum protein electrophoresis with reflex, Hepatitis B core antibody, IgM, Hepatitis B surface antigen, Hepatitis C antibody, Glucose 6 phosphate dehydrogenase, IgG, IgA, IgM  Hx of migraines-she has had chronic migraines.  She has not seen a neurologist in the past.  The MRI done of her brain recently was within normal limits.  Vitamin D deficiency -she gives history of vitamin D deficiency.  Orders: Orders Placed This Encounter  Procedures  . XR Cervical Spine 2 or 3 views  .  XR Lumbar Spine 2-3 Views  . XR KNEE 3 VIEW LEFT  . COMPLETE METABOLIC PANEL WITH GFR  . Urinalysis, Routine w reflex microscopic  . CK  . TSH  . Rheumatoid factor  . Angiotensin converting enzyme  . Cyclic citrul peptide antibody, IgG  . ANA  . Serum protein electrophoresis with reflex  . Hepatitis B core antibody, IgM  . Hepatitis B surface antigen  . Hepatitis C antibody  . Glucose 6 phosphate dehydrogenase  . IgG, IgA, IgM  . B12 and Folate Panel  . VITAMIN D 25 Hydroxy (Vit-D Deficiency, Fractures)  . Ambulatory referral to Neurology   No orders of the defined types were placed in this encounter.   Face-to-face time spent with patient was 50 minutes. Greater than 50% of time was spent in counseling and coordination of care.  Follow-Up Instructions: Return for Paresthesias.   Pollyann Savoy, MD  Note - This record has been created using Animal nutritionist.  Chart creation errors have been sought, but may not always  have been located. Such creation errors do not reflect on  the standard of  medical care.

## 2018-12-02 ENCOUNTER — Encounter: Payer: Self-pay | Admitting: Rheumatology

## 2018-12-02 ENCOUNTER — Ambulatory Visit (INDEPENDENT_AMBULATORY_CARE_PROVIDER_SITE_OTHER): Payer: Self-pay

## 2018-12-02 ENCOUNTER — Ambulatory Visit: Payer: BLUE CROSS/BLUE SHIELD | Admitting: Rheumatology

## 2018-12-02 VITALS — BP 121/77 | HR 61 | Resp 14 | Ht 66.25 in | Wt 198.2 lb

## 2018-12-02 DIAGNOSIS — M25562 Pain in left knee: Secondary | ICD-10-CM | POA: Diagnosis not present

## 2018-12-02 DIAGNOSIS — Z8669 Personal history of other diseases of the nervous system and sense organs: Secondary | ICD-10-CM

## 2018-12-02 DIAGNOSIS — M542 Cervicalgia: Secondary | ICD-10-CM

## 2018-12-02 DIAGNOSIS — M545 Low back pain, unspecified: Secondary | ICD-10-CM

## 2018-12-02 DIAGNOSIS — G8929 Other chronic pain: Secondary | ICD-10-CM

## 2018-12-02 DIAGNOSIS — E559 Vitamin D deficiency, unspecified: Secondary | ICD-10-CM

## 2018-12-02 DIAGNOSIS — R5383 Other fatigue: Secondary | ICD-10-CM

## 2018-12-02 DIAGNOSIS — R202 Paresthesia of skin: Secondary | ICD-10-CM | POA: Diagnosis not present

## 2018-12-02 DIAGNOSIS — M7122 Synovial cyst of popliteal space [Baker], left knee: Secondary | ICD-10-CM

## 2018-12-02 NOTE — Patient Instructions (Signed)
Knee Exercises Ask your health care provider which exercises are safe for you. Do exercises exactly as told by your health care provider and adjust them as directed. It is normal to feel mild stretching, pulling, tightness, or discomfort as you do these exercises, but you should stop right away if you feel sudden pain or your pain gets worse.Do not begin these exercises until told by your health care provider. STRETCHING AND RANGE OF MOTION EXERCISES These exercises warm up your muscles and joints and improve the movement and flexibility of your knee. These exercises also help to relieve pain, numbness, and tingling. Exercise A: Knee Extension, Prone 1. Lie on your abdomen on a bed. 2. Place your left / right knee just beyond the edge of the surface so your knee is not on the bed. You can put a towel under your left / right thigh just above your knee for comfort. 3. Relax your leg muscles and allow gravity to straighten your knee. You should feel a stretch behind your left / right knee. 4. Hold this position for __________ seconds. 5. Scoot up so your knee is supported between repetitions. Repeat __________ times. Complete this stretch __________ times a day. Exercise B: Knee Flexion, Active  1. Lie on your back with both knees straight. If this causes back discomfort, bend your left / right knee so your foot is flat on the floor. 2. Slowly slide your left / right heel back toward your buttocks until you feel a gentle stretch in the front of your knee or thigh. 3. Hold this position for __________ seconds. 4. Slowly slide your left / right heel back to the starting position. Repeat __________ times. Complete this exercise __________ times a day. Exercise C: Quadriceps, Prone  1. Lie on your abdomen on a firm surface, such as a bed or padded floor. 2. Bend your left / right knee and hold your ankle. If you cannot reach your ankle or pant leg, loop a belt around your foot and grab the belt  instead. 3. Gently pull your heel toward your buttocks. Your knee should not slide out to the side. You should feel a stretch in the front of your thigh and knee. 4. Hold this position for __________ seconds. Repeat __________ times. Complete this stretch __________ times a day. Exercise D: Hamstring, Supine 1. Lie on your back. 2. Loop a belt or towel over the ball of your left / right foot. The ball of your foot is on the walking surface, right under your toes. 3. Straighten your left / right knee and slowly pull on the belt to raise your leg until you feel a gentle stretch behind your knee. ? Do not let your left / right knee bend while you do this. ? Keep your other leg flat on the floor. 4. Hold this position for __________ seconds. Repeat __________ times. Complete this stretch __________ times a day. STRENGTHENING EXERCISES These exercises build strength and endurance in your knee. Endurance is the ability to use your muscles for a long time, even after they get tired. Exercise E: Quadriceps, Isometric  1. Lie on your back with your left / right leg extended and your other knee bent. Put a rolled towel or small pillow under your knee if told by your health care provider. 2. Slowly tense the muscles in the front of your left / right thigh. You should see your kneecap slide up toward your hip or see increased dimpling just above the knee. This   motion will push the back of the knee toward the floor. 3. For __________ seconds, keep the muscle as tight as you can without increasing your pain. 4. Relax the muscles slowly and completely. Repeat __________ times. Complete this exercise __________ times a day. Exercise F: Straight Leg Raises - Quadriceps 1. Lie on your back with your left / right leg extended and your other knee bent. 2. Tense the muscles in the front of your left / right thigh. You should see your kneecap slide up or see increased dimpling just above the knee. Your thigh may  even shake a bit. 3. Keep these muscles tight as you raise your leg 4-6 inches (10-15 cm) off the floor. Do not let your knee bend. 4. Hold this position for __________ seconds. 5. Keep these muscles tense as you lower your leg. 6. Relax your muscles slowly and completely after each repetition. Repeat __________ times. Complete this exercise __________ times a day. Exercise G: Hamstring, Isometric 1. Lie on your back on a firm surface. 2. Bend your left / right knee approximately __________ degrees. 3. Dig your left / right heel into the surface as if you are trying to pull it toward your buttocks. Tighten the muscles in the back of your thighs to dig as hard as you can without increasing any pain. 4. Hold this position for __________ seconds. 5. Release the tension gradually and allow your muscles to relax completely for __________ seconds after each repetition. Repeat __________ times. Complete this exercise __________ times a day. Exercise H: Hamstring Curls  If told by your health care provider, do this exercise while wearing ankle weights. Begin with __________ weights. Then increase the weight by 1 lb (0.5 kg) increments. Do not wear ankle weights that are more than __________. 1. Lie on your abdomen with your legs straight. 2. Bend your left / right knee as far as you can without feeling pain. Keep your hips flat against the floor. 3. Hold this position for __________ seconds. 4. Slowly lower your leg to the starting position.  Repeat __________ times. Complete this exercise __________ times a day. Exercise I: Squats (Quadriceps) 1. Stand in front of a table, with your feet and knees pointing straight ahead. You may rest your hands on the table for balance but not for support. 2. Slowly bend your knees and lower your hips like you are going to sit in a chair. ? Keep your weight over your heels, not over your toes. ? Keep your lower legs upright so they are parallel with the table  legs. ? Do not let your hips go lower than your knees. ? Do not bend lower than told by your health care provider. ? If your knee pain increases, do not bend as low. 3. Hold the squat position for __________ seconds. 4. Slowly push with your legs to return to standing. Do not use your hands to pull yourself to standing. Repeat __________ times. Complete this exercise __________ times a day. Exercise J: Wall Slides (Quadriceps)  1. Lean your back against a smooth wall or door while you walk your feet out 18-24 inches (46-61 cm) from it. 2. Place your feet hip-width apart. 3. Slowly slide down the wall or door until your knees bend __________ degrees. Keep your knees over your heels, not over your toes. Keep your knees in line with your hips. 4. Hold for __________ seconds. Repeat __________ times. Complete this exercise __________ times a day. Exercise K: Straight Leg Raises -   Hip Abductors 1. Lie on your side with your left / right leg in the top position. Lie so your head, shoulder, knee, and hip line up. You may bend your bottom knee to help you keep your balance. 2. Roll your hips slightly forward so your hips are stacked directly over each other and your left / right knee is facing forward. 3. Leading with your heel, lift your top leg 4-6 inches (10-15 cm). You should feel the muscles in your outer hip lifting. ? Do not let your foot drift forward. ? Do not let your knee roll toward the ceiling. 4. Hold this position for __________ seconds. 5. Slowly return your leg to the starting position. 6. Let your muscles relax completely after each repetition. Repeat __________ times. Complete this exercise __________ times a day. Exercise L: Straight Leg Raises - Hip Extensors 1. Lie on your abdomen on a firm surface. You can put a pillow under your hips if that is more comfortable. 2. Tense the muscles in your buttocks and lift your left / right leg about 4-6 inches (10-15 cm). Keep your knee  straight as you lift your leg. 3. Hold this position for __________ seconds. 4. Slowly lower your leg to the starting position. 5. Let your leg relax completely after each repetition. Repeat __________ times. Complete this exercise __________ times a day. This information is not intended to replace advice given to you by your health care provider. Make sure you discuss any questions you have with your health care provider. Document Released: 10/21/2005 Document Revised: 08/31/2016 Document Reviewed: 10/13/2015 Else[employer] Interactive Patient Education  2018 [employer]. Cervical Strain and Sprain Rehab Ask your health care provider which exercises are safe for you. Do exercises exactly as told by your health care provider and adjust them as directed. It is normal to feel mild stretching, pulling, tightness, or discomfort as you do these exercises, but you should stop right away if you feel sudden pain or your pain gets worse.Do not begin these exercises until told by your health care provider. Stretching and range of motion exercises These exercises warm up your muscles and joints and improve the movement and flexibility of your neck. These exercises also help to relieve pain, numbness, and tingling. Exercise A: Cervical side bend  1. Using good posture, sit on a stable chair or stand up. 2. Without moving your shoulders, slowly tilt your left / right ear to your shoulder until you feel a stretch in your neck muscles. You should be looking straight ahead. 3. Hold for __________ seconds. 4. Repeat with the other side of your neck. Repeat __________ times. Complete this exercise __________ times a day. Exercise B: Cervical rotation  1. Using good posture, sit on a stable chair or stand up. 2. Slowly turn your head to the side as if you are looking over your left / right shoulder. ? Keep your eyes level with the ground. ? Stop when you feel a stretch along the side and the back of your  neck. 3. Hold for __________ seconds. 4. Repeat this by turning to your other side. Repeat __________ times. Complete this exercise __________ times a day. Exercise C: Thoracic extension and pectoral stretch 1. Roll a towel or a small blanket so it is about 4 inches (10 cm) in diameter. 2. Lie down on your back on a firm surface. 3. Put the towel lengthwise, under your spine in the middle of your back. It should not be not under your shoulder  blades. The towel should line up with your spine from your middle back to your lower back. 4. Put your hands behind your head and let your elbows fall out to your sides. 5. Hold for __________ seconds. Repeat __________ times. Complete this exercise __________ times a day. Strengthening exercises These exercises build strength and endurance in your neck. Endurance is the ability to use your muscles for a long time, even after your muscles get tired. Exercise D: Upper cervical flexion, isometric 1. Lie on your back with a thin pillow behind your head and a small rolled-up towel under your neck. 2. Gently tuck your chin toward your chest and nod your head down to look toward your feet. Do not lift your head off the pillow. 3. Hold for __________ seconds. 4. Release the tension slowly. Relax your neck muscles completely before you repeat this exercise. Repeat __________ times. Complete this exercise __________ times a day. Exercise E: Cervical extension, isometric  1. Stand about 6 inches (15 cm) away from a wall, with your back facing the wall. 2. Place a soft object, about 6-8 inches (15-20 cm) in diameter, between the back of your head and the wall. A soft object could be a small pillow, a ball, or a folded towel. 3. Gently tilt your head back and press into the soft object. Keep your jaw and forehead relaxed. 4. Hold for __________ seconds. 5. Release the tension slowly. Relax your neck muscles completely before you repeat this exercise. Repeat  __________ times. Complete this exercise __________ times a day. Posture and body mechanics  Body mechanics refers to the movements and positions of your body while you do your daily activities. Posture is part of body mechanics. Good posture and healthy body mechanics can help to relieve stress in your body's tissues and joints. Good posture means that your spine is in its natural S-curve position (your spine is neutral), your shoulders are pulled back slightly, and your head is not tipped forward. The following are general guidelines for applying improved posture and body mechanics to your everyday activities. Standing  When standing, keep your spine neutral and keep your feet about hip-width apart. Keep a slight bend in your knees. Your ears, shoulders, and hips should line up.  When you do a task in which you stand in one place for a long time, place one foot up on a stable object that is 2-4 inches (5-10 cm) high, such as a footstool. This helps keep your spine neutral. Sitting   When sitting, keep your spine neutral and your keep feet flat on the floor. Use a footrest, if necessary, and keep your thighs parallel to the floor. Avoid rounding your shoulders, and avoid tilting your head forward.  When working at a desk or a computer, keep your desk at a height where your hands are slightly lower than your elbows. Slide your chair under your desk so you are close enough to maintain good posture.  When working at a computer, place your monitor at a height where you are looking straight ahead and you do not have to tilt your head forward or downward to look at the screen. Resting When lying down and resting, avoid positions that are most painful for you. Try to support your neck in a neutral position. You can use a contour pillow or a small rolled-up towel. Your pillow should support your neck but not push on it. This information is not intended to replace advice given to you by your   health care  provider. Make sure you discuss any questions you have with your health care provider. Document Released: 12/07/2005 Document Revised: 08/13/2016 Document Reviewed: 11/13/2015 [employer] Interactive Patient Education  2018 [employer] Back Exercises The following exercises strengthen the muscles that help to support the back. They also help to keep the lower back flexible. Doing these exercises can help to prevent back pain or lessen existing pain. If you have back pain or discomfort, try doing these exercises 2-3 times each day or as told by your health care provider. When the pain goes away, do them once each day, but increase the number of times that you repeat the steps for each exercise (do more repetitions). If you do not have back pain or discomfort, do these exercises once each day or as told by your health care provider. Exercises Single Knee to Chest  Repeat these steps 3-5 times for each leg: 1. Lie on your back on a firm bed or the floor with your legs extended. 2. Bring one knee to your chest. Your other leg should stay extended and in contact with the floor. 3. Hold your knee in place by grabbing your knee or thigh. 4. Pull on your knee until you feel a gentle stretch in your lower back. 5. Hold the stretch for 10-30 seconds. 6. Slowly release and straighten your leg.  Pelvic Tilt  Repeat these steps 5-10 times: 1. Lie on your back on a firm bed or the floor with your legs extended. 2. Bend your knees so they are pointing toward the ceiling and your feet are flat on the floor. 3. Tighten your lower abdominal muscles to press your lower back against the floor. This motion will tilt your pelvis so your tailbone points up toward the ceiling instead of pointing to your feet or the floor. 4. With gentle tension and even breathing, hold this position for 5-10 seconds.  Cat-Cow  Repeat these steps until your lower back becomes more flexible: 1. Get into a hands-and-knees position  on a firm surface. Keep your hands under your shoulders, and keep your knees under your hips. You may place padding under your knees for comfort. 2. Let your head hang down, and point your tailbone toward the floor so your lower back becomes rounded like the back of a cat. 3. Hold this position for 5 seconds. 4. Slowly lift your head and point your tailbone up toward the ceiling so your back forms a sagging arch like the back of a cow. 5. Hold this position for 5 seconds.  Press-Ups  Repeat these steps 5-10 times: 1. Lie on your abdomen (face-down) on the floor. 2. Place your palms near your head, about shoulder-width apart. 3. While you keep your back as relaxed as possible and keep your hips on the floor, slowly straighten your arms to raise the top half of your body and lift your shoulders. Do not use your back muscles to raise your upper torso. You may adjust the placement of your hands to make yourself more comfortable. 4. Hold this position for 5 seconds while you keep your back relaxed. 5. Slowly return to lying flat on the floor.  Bridges  Repeat these steps 10 times: 1. Lie on your back on a firm surface. 2. Bend your knees so they are pointing toward the ceiling and your feet are flat on the floor. 3. Tighten your buttocks muscles and lift your buttocks off of the floor until your waist is at almost the  same height as your knees. You should feel the muscles working in your buttocks and the back of your thighs. If you do not feel these muscles, slide your feet 1-2 inches farther away from your buttocks. 4. Hold this position for 3-5 seconds. 5. Slowly lower your hips to the starting position, and allow your buttocks muscles to relax completely.  If this exercise is too easy, try doing it with your arms crossed over your chest. Abdominal Crunches  Repeat these steps 5-10 times: 1. Lie on your back on a firm bed or the floor with your legs extended. 2. Bend your knees so they are  pointing toward the ceiling and your feet are flat on the floor. 3. Cross your arms over your chest. 4. Tip your chin slightly toward your chest without bending your neck. 5. Tighten your abdominal muscles and slowly raise your trunk (torso) high enough to lift your shoulder blades a tiny bit off of the floor. Avoid raising your torso higher than that, because it can put too much stress on your low back and it does not help to strengthen your abdominal muscles. 6. Slowly return to your starting position.  Back Lifts Repeat these steps 5-10 times: 1. Lie on your abdomen (face-down) with your arms at your sides, and rest your forehead on the floor. 2. Tighten the muscles in your legs and your buttocks. 3. Slowly lift your chest off of the floor while you keep your hips pressed to the floor. Keep the back of your head in line with the curve in your back. Your eyes should be looking at the floor. 4. Hold this position for 3-5 seconds. 5. Slowly return to your starting position.  Contact a health care provider if:  Your back pain or discomfort gets much worse when you do an exercise.  Your back pain or discomfort does not lessen within 2 hours after you exercise. If you have any of these problems, stop doing these exercises right away. Do not do them again unless your health care provider says that you can. Get help right away if:  You develop sudden, severe back pain. If this happens, stop doing the exercises right away. Do not do them again unless your health care provider says that you can. This information is not intended to replace advice given to you by your health care provider. Make sure you discuss any questions you have with your health care provider. Document Released: 01/14/2005 Document Revised: 04/15/2016 Document Reviewed: 01/31/2015 [employer] Interactive Patient Education  2017 [employer]

## 2018-12-03 LAB — VITAMIN D 25 HYDROXY (VIT D DEFICIENCY, FRACTURES): Vit D, 25-Hydroxy: 44 ng/mL (ref 30–100)

## 2018-12-05 ENCOUNTER — Ambulatory Visit: Payer: BLUE CROSS/BLUE SHIELD | Admitting: Neurology

## 2018-12-05 ENCOUNTER — Encounter: Payer: Self-pay | Admitting: Neurology

## 2018-12-05 VITALS — BP 130/90 | HR 79 | Ht 66.25 in | Wt 193.2 lb

## 2018-12-05 DIAGNOSIS — G43709 Chronic migraine without aura, not intractable, without status migrainosus: Secondary | ICD-10-CM | POA: Diagnosis not present

## 2018-12-05 DIAGNOSIS — G4440 Drug-induced headache, not elsewhere classified, not intractable: Secondary | ICD-10-CM

## 2018-12-05 DIAGNOSIS — F418 Other specified anxiety disorders: Secondary | ICD-10-CM | POA: Diagnosis not present

## 2018-12-05 DIAGNOSIS — R202 Paresthesia of skin: Secondary | ICD-10-CM | POA: Diagnosis not present

## 2018-12-05 LAB — URINALYSIS, ROUTINE W REFLEX MICROSCOPIC
BILIRUBIN URINE: NEGATIVE
GLUCOSE, UA: NEGATIVE
HGB URINE DIPSTICK: NEGATIVE
Ketones, ur: NEGATIVE
LEUKOCYTES UA: NEGATIVE
Nitrite: NEGATIVE
Protein, ur: NEGATIVE
Specific Gravity, Urine: 1.01 (ref 1.001–1.03)
pH: 6.5 (ref 5.0–8.0)

## 2018-12-05 LAB — COMPLETE METABOLIC PANEL WITH GFR
AG RATIO: 1.4 (calc) (ref 1.0–2.5)
ALKALINE PHOSPHATASE (APISO): 60 U/L (ref 33–115)
ALT: 8 U/L (ref 6–29)
AST: 9 U/L — ABNORMAL LOW (ref 10–30)
Albumin: 4.1 g/dL (ref 3.6–5.1)
BILIRUBIN TOTAL: 0.3 mg/dL (ref 0.2–1.2)
BUN: 11 mg/dL (ref 7–25)
CHLORIDE: 103 mmol/L (ref 98–110)
CO2: 27 mmol/L (ref 20–32)
Calcium: 9.5 mg/dL (ref 8.6–10.2)
Creat: 0.57 mg/dL (ref 0.50–1.10)
GFR, EST AFRICAN AMERICAN: 148 mL/min/{1.73_m2} (ref >=60)
GFR, Est Non African American: 128 mL/min/{1.73_m2} (ref >=60)
Globulin: 3 g/dL (calc) (ref 1.9–3.7)
Glucose, Bld: 89 mg/dL (ref 65–99)
POTASSIUM: 4.2 mmol/L (ref 3.5–5.3)
Sodium: 138 mmol/L (ref 135–146)
TOTAL PROTEIN: 7.1 g/dL (ref 6.1–8.1)

## 2018-12-05 LAB — CYCLIC CITRUL PEPTIDE ANTIBODY, IGG: Cyclic Citrullin Peptide Ab: <16 UNITS

## 2018-12-05 LAB — IGG, IGA, IGM
IgG (Immunoglobin G), Serum: 1386 mg/dL (ref 600–1640)
IgM, Serum: 97 mg/dL (ref 50–300)
Immunoglobulin A: 211 mg/dL (ref 47–310)

## 2018-12-05 LAB — RHEUMATOID FACTOR

## 2018-12-05 LAB — PROTEIN ELECTROPHORESIS, SERUM, WITH REFLEX
ALPHA 2: 0.7 g/dL (ref 0.5–0.9)
Albumin ELP: 4 g/dL (ref 3.8–4.8)
Alpha 1: 0.4 g/dL — ABNORMAL HIGH (ref 0.2–0.3)
BETA 2: 0.4 g/dL (ref 0.2–0.5)
BETA GLOBULIN: 0.5 g/dL (ref 0.4–0.6)
Gamma Globulin: 1.2 g/dL (ref 0.8–1.7)
Total Protein: 7.2 g/dL (ref 6.1–8.1)

## 2018-12-05 LAB — HEPATITIS C ANTIBODY
Hepatitis C Ab: NONREACTIVE
SIGNAL TO CUT-OFF: 0.02 (ref <1.00)

## 2018-12-05 LAB — HEPATITIS B CORE ANTIBODY, IGM: HEP B C IGM: NONREACTIVE

## 2018-12-05 LAB — HEPATITIS B SURFACE ANTIGEN: Hepatitis B Surface Ag: NONREACTIVE

## 2018-12-05 LAB — B12 AND FOLATE PANEL
FOLATE: 12.8 ng/mL
VITAMIN B 12: 1273 pg/mL — AB (ref 200–1100)

## 2018-12-05 LAB — CK: Total CK: 53 U/L (ref 29–143)

## 2018-12-05 LAB — ANA: Anti Nuclear Antibody(ANA): NEGATIVE

## 2018-12-05 LAB — ANGIOTENSIN CONVERTING ENZYME: Angiotensin-Converting Enzyme: 19 U/L (ref 9–67)

## 2018-12-05 LAB — GLUCOSE 6 PHOSPHATE DEHYDROGENASE: G-6PDH: 16.1 U/g Hgb (ref 7.0–20.5)

## 2018-12-05 LAB — TSH: TSH: 1.29 mIU/L

## 2018-12-05 NOTE — Patient Instructions (Addendum)
Start magnesium oxide 400-600mg  daily for headaches  Reduce all over the counter medications for headaches to twice per week  We will order nerve testing of the left arm and leg   ELECTROMYOGRAM AND NERVE CONDUCTION STUDIES (EMG/NCS) INSTRUCTIONS  How to Prepare The neurologist conducting the EMG will need to know if you have certain medical conditions. Tell the neurologist and other EMG lab personnel if you: . Have a pacemaker or any other electrical medical device . Take blood-thinning medications . Have hemophilia, a blood-clotting disorder that causes prolonged bleeding Bathing Take a shower or bath shortly before your exam in order to remove oils from your skin. Don't apply lotions or creams before the exam.  What to Expect You'll likely be asked to change into a hospital gown for the procedure and lie down on an examination table. The following explanations can help you understand what will happen during the exam.  . Electrodes. The neurologist or a technician places surface electrodes at various locations on your skin depending on where you're experiencing symptoms. Or the neurologist may insert needle electrodes at different sites depending on your symptoms.  . Sensations. The electrodes will at times transmit a tiny electrical current that you may feel as a twinge or spasm. The needle electrode may cause discomfort or pain that usually ends shortly after the needle is removed. If you are concerned about discomfort or pain, you may want to talk to the neurologist about taking a short break during the exam.  . Instructions. During the needle EMG, the neurologist will assess whether there is any spontaneous electrical activity when the muscle is at rest - activity that isn't present in healthy muscle tissue - and the degree of activity when you slightly contract the muscle.  He or she will give you instructions on resting and contracting a muscle at appropriate times. Depending on what  muscles and nerves the neurologist is examining, he or she may ask you to change positions during the exam.  After your EMG You may experience some temporary, minor bruising where the needle electrode was inserted into your muscle. This bruising should fade within several days. If it persists, contact your primary care doctor.

## 2018-12-05 NOTE — Progress Notes (Signed)
[facility] Clinic Note - Initial Visit   Date: 12/05/18  Mary Simon MRN: [id number] DOB: [birth year]   Dear Dr. Corliss Skains:  Thank you for your kind referral of Mary Simon for consultation of paresthesias. Although her history is well known to you, please allow [place] to reiterate it for the purpose of our medical record. The patient was accompanied to the clinic by mother.    History of Present Illness: Mary Simon is a 27 y.o. right-handed [place] American female with migraines and anxiety presenting for evaluation of paresthesias of the hands and feet.    She had a panic attack in October 2018 on campus, possibly due to stress related to work and going to [employer].  Since then, she has a myriaid of symptoms including generalized numbness/tingling, headaches, chest pain, skin changes, and myalgias.  She was recommended to start buspar by her PCP for anxiety, which she does not take. Following this event, she has numbness/tingling of the hands and toes which has been progressively getting worse.  Sitting makes her symptoms worse because she feels there is a "formation" over the crease of her knee and it applies pressure to the left leg.  She feels that driving makes her hands more numbness.  Paresthesias do not wake her up from sleeping.   She has migraines for several years.  Headaches occur about 2-4 days of the week, lasting a few hours.  Pain is localized bifrontal, described as throbbing.  No specific triggers.  She has light sensitivity, no nausea/vominting.  She does not have family history of migraine. She treated migraines with Excedrin 2-4 times per week, which helps.   She has been to the ER twice and urgent care twice because of chest pain and paresthesias. MRI brain in November 2019 was normal.  She saw rheumatology for myalgias/polyarthralgia last week.  She also has seen optometry and was told she has elevated pressure, concerning for  glaucoma in one of her eyes.    She is a full-time Careers information officer of Social Work. She lives in [place] with a roommate.  Out-side paper records, electronic medical record, and images have been reviewed where available and summarized as:  Lab Results  Component Value Date   TSH 1.29 12/02/2018   Lab Results  Component Value Date   ESRSEDRATE 20 11/16/2018   Lab Results  Component Value Date   CRP 1.4 (H) 11/16/2018   Lab Results  Component Value Date   VITAMINB12 1,273 (H) 12/02/2018   MRI brain wo contrast 11/06/2018:  Normal brain MRI.  No acute intracranial abnormality identified.  Past Medical History:  Diagnosis Date  . Anxiety   . Migraine     History reviewed. No pertinent surgical history.   Medications:  Outpatient Encounter Medications as of 12/05/2018  Medication Sig  . Aspirin-Acetaminophen-Caffeine (EXCEDRIN MIGRAINE PO) Take 1-2 tablets by mouth daily as needed (pain).  . busPIRone (BUSPAR) 5 MG tablet Take 5 mg by mouth 2 (two) times daily.  . cholecalciferol (VITAMIN D) 1000 UNITS tablet Take 1,000 Units by mouth as needed.   . Cyanocobalamin (B-12 PO) Take by mouth.  . ferrous gluconate (FERGON) 324 MG tablet Take 324 mg by mouth as needed.   . naproxen sodium (ALEVE) 220 MG tablet Take 220-440 mg by mouth daily as needed (pain).  . TRI-PREVIFEM 0.18/0.215/0.25 MG-35 MCG tablet Take 1 tablet by mouth daily.   No facility-administered encounter medications on file as of 12/05/2018.  Allergies: No Known Allergies  Family History: Family History  Problem Relation Age of Onset  . Diabetes Mother   . Diabetes Father   . Stroke Father   . Down syndrome Brother   . Hypothyroidism Brother   . Lung cancer Maternal Grandfather     Social History: Social History   Tobacco Use  . Smoking status: Never Smoker  . Smokeless tobacco: Never Used  Substance Use Topics  . Alcohol use: Yes    Comment: Social  . Drug use: No    Social History   Social History Narrative   Lives with roommate in an apartment on the 2nd floor.  No children.  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      Review of Systems:  CONSTITUTIONAL: No fevers, chills, night sweats, or weight loss.   EYES: No visual changes or eye pain ENT: No hearing changes.  No history of nose bleeds.   RESPIRATORY: No cough, wheezing and shortness of breath.   CARDIOVASCULAR: Negative for chest pain, and palpitations.   GI: Negative for abdominal discomfort, blood in stools or black stools.  No recent change in bowel habits.   GU:  No history of incontinence.   MUSCLOSKELETAL: +history of joint pain or swelling.  +myalgias.   SKIN: Negative for lesions, rash, and itching.   HEMATOLOGY/ONCOLOGY: Negative for prolonged bleeding, bruising easily, and swollen nodes.  No history of cancer.   ENDOCRINE: Negative for cold or heat intolerance, polydipsia or goiter.   PSYCH:  No depression +anxiety symptoms.   NEURO: As Above.   Vital Signs:  BP 130/90   Pulse 79   Ht 5' 6.25" (1.683 m)   Wt 193 lb 4 oz (87.7 kg)   LMP 11/25/2018   SpO2 97%   BMI 30.96 kg/m    General Medical Exam:   General:  Well appearing, comfortable.   Eyes/ENT: see cranial nerve examination.   Neck: No masses appreciated.  Full range of motion without tenderness.  No carotid bruits. Respiratory:  Clear to auscultation, good air entry bilaterally.   Cardiac:  Regular rate and rhythm, no murmur.   Extremities:  No deformities, edema, or skin discoloration.  Small nodular soft tissue change over the left poplitial fossa  Skin:  No rashes or lesions.  Neurological Exam: MENTAL STATUS including orientation to time, place, person, recent and remote memory, attention span and concentration, language, and fund of knowledge is normal.  Speech is not dysarthric.  CRANIAL NERVES: II:  No visual field defects.  Unremarkable fundi.   III-IV-VI: Pupils equal round  and reactive to light.  Normal conjugate, extra-ocular eye movements in all directions of gaze.  No nystagmus.  No ptosis.   V:  Normal facial sensation.    VII:  Normal facial symmetry and movements.  No pathologic facial reflexes.  VIII:  Normal hearing and vestibular function.   IX-X:  Normal palatal movement.   XI:  Normal shoulder shrug and head rotation.   XII:  Normal tongue strength and range of motion, no deviation or fasciculation.  MOTOR:  No atrophy, fasciculations or abnormal movements.  No pronator drift.  Tone is normal.    Right Upper Extremity:    Left Upper Extremity:    Deltoid  5/5   Deltoid  5/5   Biceps  5/5   Biceps  5/5   Triceps  5/5   Triceps  5/5   Wrist extensors  5/5   Wrist  extensors  5/5   Wrist flexors  5/5   Wrist flexors  5/5   Finger extensors  5/5   Finger extensors  5/5   Finger flexors  5/5   Finger flexors  5/5   Dorsal interossei  5/5   Dorsal interossei  5/5   Abductor pollicis  5/5   Abductor pollicis  5/5   Tone (Ashworth scale)  0  Tone (Ashworth scale)  0   Right Lower Extremity:    Left Lower Extremity:    Hip flexors  5/5   Hip flexors  5/5   Hip extensors  5/5   Hip extensors  5/5   Knee flexors  5/5   Knee flexors  5/5   Knee extensors  5/5   Knee extensors  5/5   Dorsiflexors  5/5   Dorsiflexors  5/5   Plantarflexors  5/5   Plantarflexors  5/5   Toe extensors  5/5   Toe extensors  5/5   Toe flexors  5/5   Toe flexors  5/5   Tone (Ashworth scale)  0  Tone (Ashworth scale)  0   MSRs:  Right                                                                 Left brachioradialis 2+  brachioradialis 2+  biceps 2+  biceps 2+  triceps 2+  triceps 2+  patellar 2+  patellar 2+  ankle jerk 2+  ankle jerk 2+  Hoffman no  Hoffman no  plantar response down  plantar response down   SENSORY:  Normal and symmetric perception of light touch, pinprick, vibration, and proprioception.  Romberg's sign absent.   COORDINATION/GAIT: Normal  finger-to- nose-finger and heel-to-shin.  Intact rapid alternating movements bilaterally.  Gait narrow based and stable. Tandem and stressed gait intact.    IMPRESSION: 1.  Migratory paresthesias which do not confirm to a cutaneous nerve or dermatomal distribution makes neurological pathology very low.  She has normal MRI brain which was personally viewed and normal neurological exam.  I reassured patient that with her imaging and exam being normal, worrisome pathology is very low.  Laboratory testing including TSH, CK, vitamin B12, folate, and vitamin D is normal.  I will proceed with NCS/EMG of the left arm and leg, but my overall suspicion of peripheral nerve pathology is very low.    2.  Chronic migraine with headaches occurring > 15 days of the month.  Start magnesium 400-60mg  daily as a preventative agent.    3.  Medication overuse headaches.  Limit all OTC pain medications to twice per week to minimize risk of rebound headaches.   4.  Stress reaction, anxiety. Follow-up with PCP for medication management.     Thank you for allowing me to participate in patient's care.  If I can answer any additional questions, I would be pleased to do so.    Sincerely,    Carra Brindley K. Allena KatzPatel, DO

## 2018-12-27 ENCOUNTER — Encounter: Payer: BLUE CROSS/BLUE SHIELD | Admitting: Neurology

## 2019-01-04 ENCOUNTER — Ambulatory Visit: Payer: BLUE CROSS/BLUE SHIELD | Admitting: Rheumatology

## 2019-01-05 NOTE — Progress Notes (Signed)
Office Visit Note  Patient: Mary Simon             Date of Birth: 11/15/1991           MRN: [id number]             PCP: Andi DevonShelton, Kimberly, MD Referring: Andi DevonShelton, Kimberly, MD Visit Date: 01/18/2019 Occupation: @GUAROCC @  Subjective:  Lower back pain   History of Present Illness: Mary Simon is a 28 y.o. female with history of polyarthralgia.  She states she has been doing some exercises for C-spine which is been helpful.  She continues to have lower back pain.  Pain has improved as well.  She was placed on prednisone pack and she has noticed since then the paresthesias in her hands have improved.  Activities of Daily Living:  Patient reports morning stiffness for 0 minute.   Patient Reports nocturnal pain.  Difficulty dressing/grooming: Denies Difficulty climbing stairs: Denies Difficulty getting out of chair: Denies Difficulty using hands for taps, buttons, cutlery, and/or writing: Denies  Review of Systems  Constitutional: Positive for fatigue. Negative for night sweats, weight gain and weight loss.  HENT: Positive for mouth dryness. Negative for mouth sores, trouble swallowing, trouble swallowing and nose dryness.   Eyes: Negative for pain, redness, visual disturbance and dryness.  Respiratory: Negative for cough, shortness of breath and difficulty breathing.   Cardiovascular: Negative for chest pain, palpitations, hypertension, irregular heartbeat and swelling in legs/feet.  Gastrointestinal: Negative for blood in stool, constipation and diarrhea.  Endocrine: Negative for increased urination.  Genitourinary: Negative for vaginal dryness.  Musculoskeletal: Positive for arthralgias and joint pain. Negative for joint swelling, myalgias, muscle weakness, morning stiffness, muscle tenderness and myalgias.  Skin: Negative for color change, rash, hair loss, skin tightness, ulcers and sensitivity to sunlight.  Allergic/Immunologic: Negative for susceptible to infections.    Neurological: Positive for parasthesias. Negative for dizziness, memory loss, night sweats and weakness.  Hematological: Negative for swollen glands.  Psychiatric/Behavioral: Negative for depressed mood and sleep disturbance. The patient is nervous/anxious.     PMFS History:  Patient Active Problem List   Diagnosis Date Noted  . Vitamin D deficiency 12/02/2018  . Hx of migraines 12/02/2018    Past Medical History:  Diagnosis Date  . Anxiety   . Migraine     Family History  Problem Relation Age of Onset  . Diabetes Mother   . Diabetes Father   . Stroke Father   . Down syndrome Brother   . Hypothyroidism Brother   . Lung cancer Maternal Grandfather    History reviewed. No pertinent surgical history. Social History   Social History Narrative   Lives with roommate in an apartment on the 2nd floor.  No children.  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      There is no immunization history on file for this patient.   Objective: Vital Signs: BP 127/83 (BP Location: Right Arm, Patient Position: Sitting, Cuff Size: Normal)   Pulse 61   Resp 13   Ht 5\' 6"  (1.676 m)   Wt 195 lb (88.5 kg)   BMI 31.47 kg/m    Physical Exam Vitals signs and nursing note reviewed.  Constitutional:      Appearance: She is well-developed.  HENT:     Head: Normocephalic and atraumatic.  Eyes:     Conjunctiva/sclera: Conjunctivae normal.  Neck:     Musculoskeletal: Normal range of motion.  Cardiovascular:  Rate and Rhythm: Normal rate and regular rhythm.     Heart sounds: Normal heart sounds.  Pulmonary:     Effort: Pulmonary effort is normal.     Breath sounds: Normal breath sounds.  Abdominal:     General: Bowel sounds are normal.     Palpations: Abdomen is soft.  Lymphadenopathy:     Cervical: No cervical adenopathy.  Skin:    General: Skin is warm and dry.     Capillary Refill: Capillary refill takes less than 2 seconds.  Neurological:     Mental Status:  She is alert and oriented to person, place, and time.  Psychiatric:        Behavior: Behavior normal.      Musculoskeletal Exam: C-spine thoracic lumbar spine good range of motion.  Shoulder joints elbow joints wrist joint MCPs PIPs DIPs been good range of motion with no synovitis.  Hip joints knee joints ankles MTPs PIPs DIPs with good range of motion with no synovitis.  He has some discomfort in the lower lumbar muscular region.  CDAI Exam: CDAI Score: Not documented Patient Global Assessment: Not documented; Provider Global Assessment: Not documented Swollen: Not documented; Tender: Not documented Joint Exam   Not documented   There is currently no information documented on the homunculus. Go to the Rheumatology activity and complete the homunculus joint exam.  Investigation: No additional findings.  Imaging: No results found.  Recent Labs: Lab Results  Component Value Date   WBC 8.7 11/16/2018   HGB 13.0 11/16/2018   PLT 355 11/16/2018   NA 138 12/02/2018   K 4.2 12/02/2018   CL 103 12/02/2018   CO2 27 12/02/2018   GLUCOSE 89 12/02/2018   BUN 11 12/02/2018   CREATININE 0.57 12/02/2018   BILITOT 0.3 12/02/2018   AST 9 (L) 12/02/2018   ALT 8 12/02/2018   PROT 7.1 12/02/2018   PROT 7.2 12/02/2018   CALCIUM 9.5 12/02/2018   GFRAA 148 12/02/2018  UA negative, SPEP alpha-1 globulin increased, immunoglobulins normal, hepatitis B-, hepatitis C negative, G6PD normal, vitamin D 44, TSH normal, CK 53, folate normal, B12 elevated, RF negative, anti-CCP negative, ACE normal   Speciality Comments: No specialty comments available.  Procedures:  No procedures performed Allergies: Patient has no known allergies.   Assessment / Plan:     Visit Diagnoses: Chronic midline low back pain without sciatica-patient continues to have some lower back pain.  She states she has been doing exercises which have been helpful.  I gave her handout on more exercises.  Neck pain-neck pain is  improved with the exercises.  Polyarthralgia - X-ray of C-spine, lumbar spine and knee joints were unremarkable at last visit.  All autoimmune work-up is negative.  Paresthesias - Patient is getting evaluated by Dr. Allena Katz.  Patient states she had resolution of her symptoms after taking the prednisone.  We discussed possibly could be carpal tunnel syndrome.  If her symptoms recur she can notify us.  I detailed discussion about the labs which were obtained last visit.  Hx of migraines  Vitamin D deficiency -last vitamin D level was normal.  Orders: No orders of the defined types were placed in this encounter.  No orders of the defined types were placed in this encounter.    Follow-Up Instructions: Return if symptoms worsen or fail to improve.   Pollyann Savoy, MD  Note - This record has been created using Animal nutritionist.  Chart creation errors have been sought, but may not always  have been located. Such creation errors do not reflect on  the standard of medical care.

## 2019-01-18 ENCOUNTER — Ambulatory Visit: Payer: BLUE CROSS/BLUE SHIELD | Admitting: Rheumatology

## 2019-01-18 ENCOUNTER — Encounter: Payer: Self-pay | Admitting: Rheumatology

## 2019-01-18 VITALS — BP 127/83 | HR 61 | Resp 13 | Ht 66.0 in | Wt 195.0 lb

## 2019-01-18 DIAGNOSIS — Z8669 Personal history of other diseases of the nervous system and sense organs: Secondary | ICD-10-CM

## 2019-01-18 DIAGNOSIS — M545 Low back pain: Secondary | ICD-10-CM | POA: Diagnosis not present

## 2019-01-18 DIAGNOSIS — M542 Cervicalgia: Secondary | ICD-10-CM

## 2019-01-18 DIAGNOSIS — M2550 Pain in unspecified joint: Secondary | ICD-10-CM

## 2019-01-18 DIAGNOSIS — R202 Paresthesia of skin: Secondary | ICD-10-CM | POA: Diagnosis not present

## 2019-01-18 DIAGNOSIS — G8929 Other chronic pain: Secondary | ICD-10-CM

## 2019-01-18 DIAGNOSIS — E559 Vitamin D deficiency, unspecified: Secondary | ICD-10-CM

## 2019-08-31 ENCOUNTER — Telehealth: Payer: Self-pay | Admitting: Rheumatology

## 2019-08-31 NOTE — Telephone Encounter (Signed)
Patient called stating she had a new patient appointment with Dr. Estanislado Pandy on 12/02/18 and x-rays were taken of her lumbar and cervical spine.  Patient states she scheduled an appointment with Dr. Marykay Lex at the [facility] on Monday, 09/18/19.  Patient states they want her to bring a copy of the x-rays to her appointment.  Patient is requesting a return call.

## 2019-08-31 NOTE — Telephone Encounter (Signed)
Advised patient we will work on getting her x-rays ready and will call when they are ready.

## 2019-08-31 NOTE — Telephone Encounter (Signed)
Called patient and let her know that the CD was up front and ready to pick up at her convenience.

## 2020-08-08 IMAGING — MR MR HEAD W/O CM
8 of 10 series · 36 of 48 positions shown · non-contrast
Comparison: None available.

CLINICAL DATA: Initial evaluation for chronic headache, normal
neurologic exam.

EXAM:
MRI HEAD WITHOUT CONTRAST
TECHNIQUE: Multiplanar, multiecho pulse sequences of the brain and surrounding
structures were obtained without intravenous contrast.

[Series 3: DWI · axial · 3.0mm · 1.09mm/px · z∈[-67,+89]mm · 8 of 108 slices shown (1 of 4)]
[im 1/108]
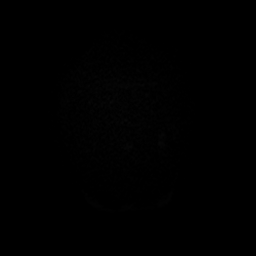
[im 12/108]
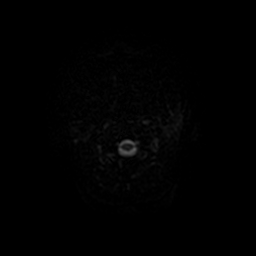
[im 36/108]
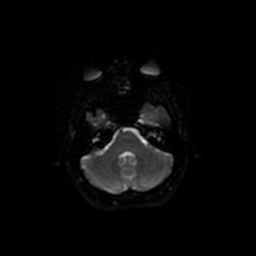
[im 48/108]
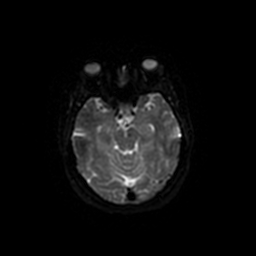
[im 60/108]
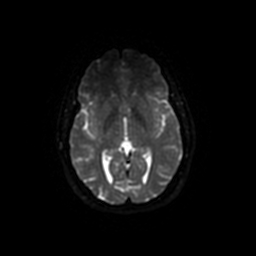
[im 72/108]
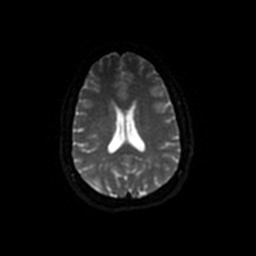
[im 96/108]
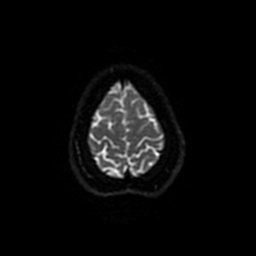
[im 108/108]
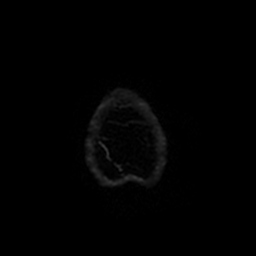

[Series 4: T1 · sagittal · 5.0mm · 0.47mm/px · 2 of 24 slices shown]
[im 1/24]
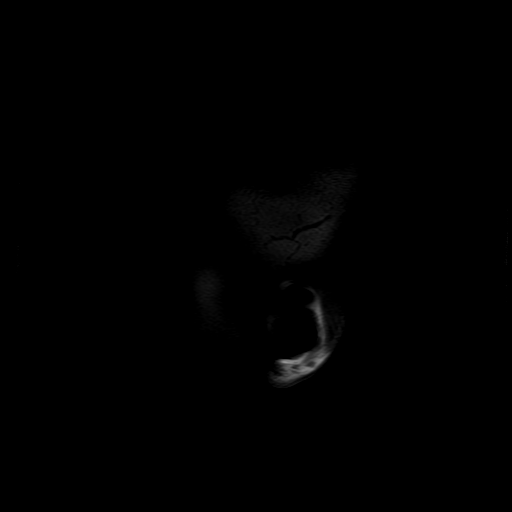
[im 12/24]
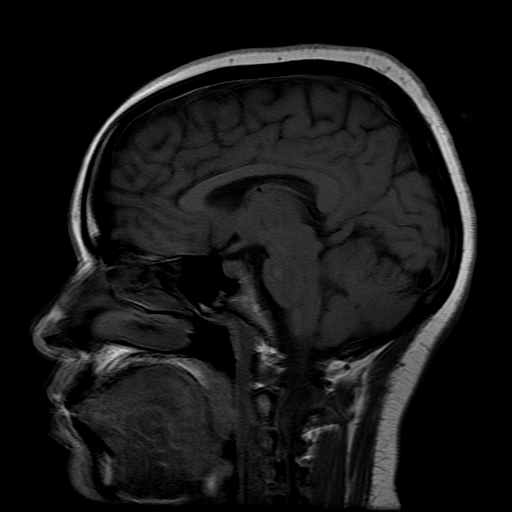

[Series 5: DWI · coronal · 4.0mm · 1.09mm/px · 7 of 70 slices shown (2 of 4)]
[im 1/70]
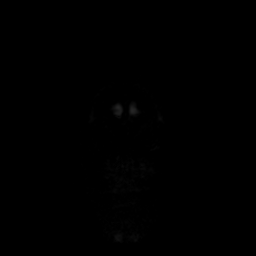
[im 12/70]
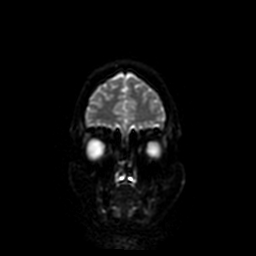
[im 24/70]
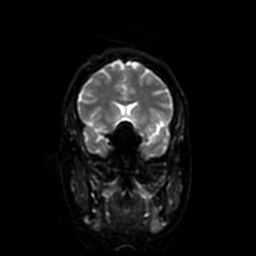
[im 35/70]
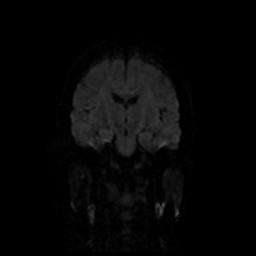
[im 47/70]
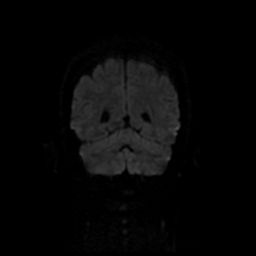
[im 58/70]
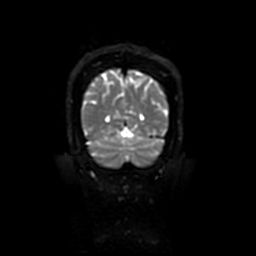
[im 70/70]
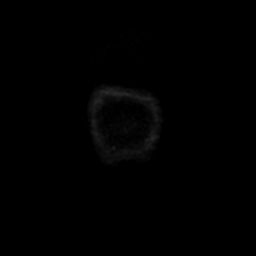

[Series 6: T2 · axial · 5.0mm · 0.43mm/px · z∈[-60,+89]mm · 3 of 27 slices shown (1 of 2)]
[im 1/27]
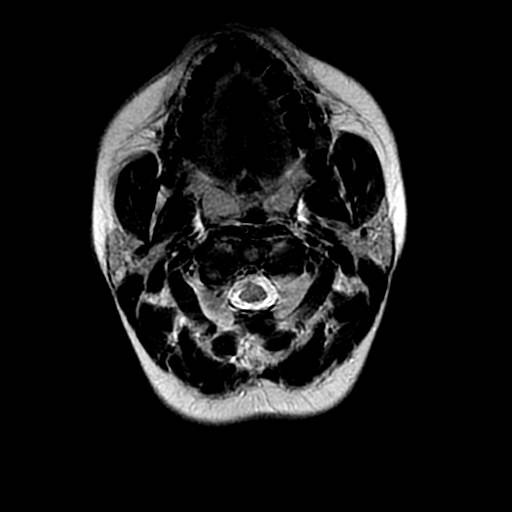
[im 14/27]
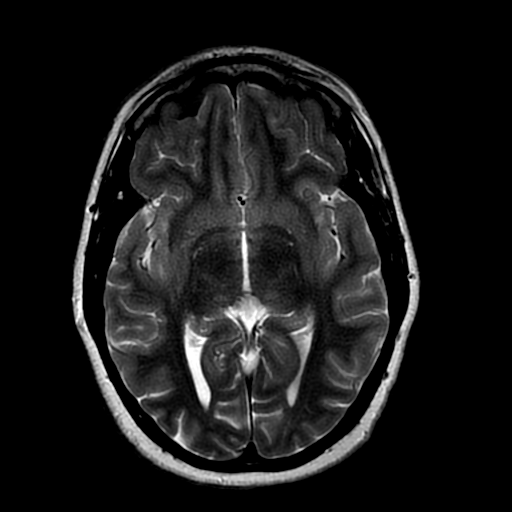
[im 27/27]
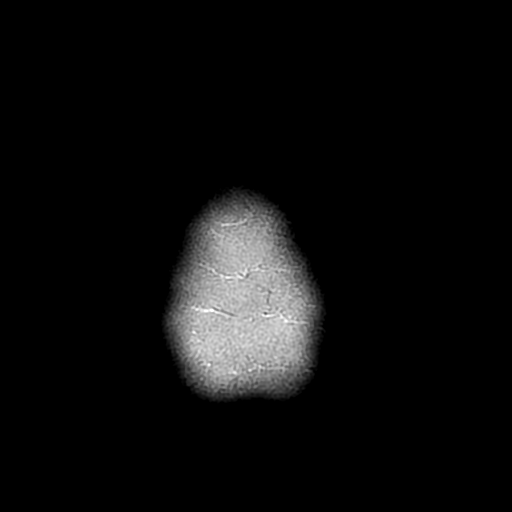

[Series 7: FLAIR · axial · 5.0mm · 0.43mm/px · z∈[-60,+89]mm · 3 of 27 slices shown]
[im 1/27]
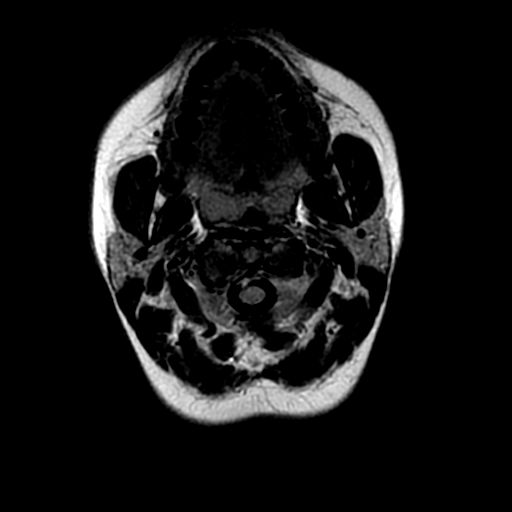
[im 14/27]
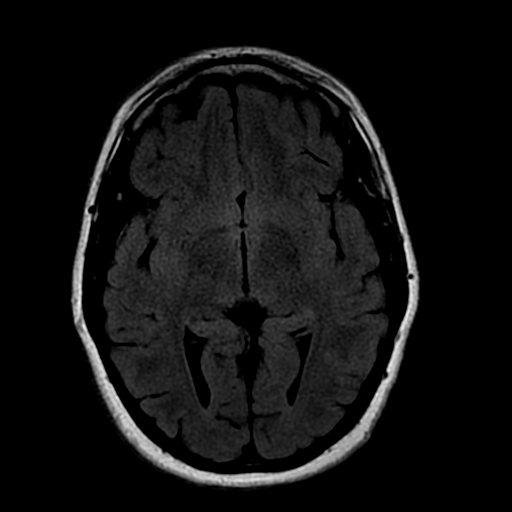
[im 27/27]
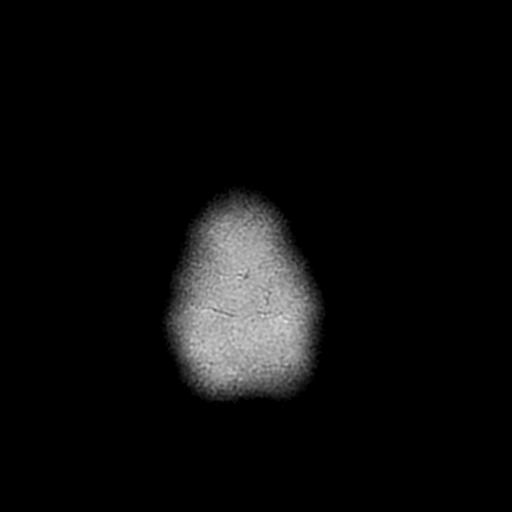

[Series 10: T2 · coronal · 5.0mm · 0.45mm/px · 3 of 28 slices shown (2 of 2)]
[im 1/28]
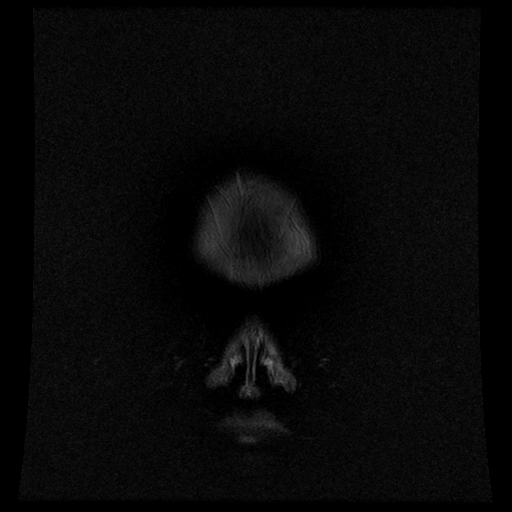
[im 14/28]
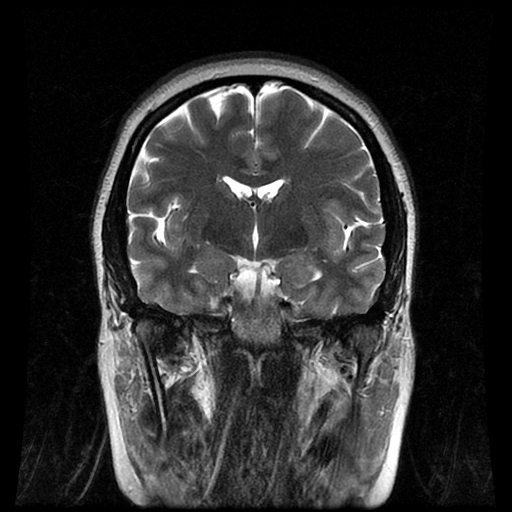
[im 28/28]
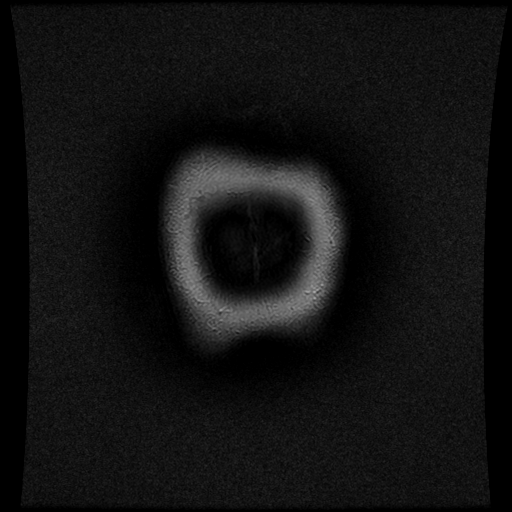

[Series 300: DWI · axial · 3.0mm · 1.09mm/px · z∈[-67,+89]mm · 6 of 54 slices shown (3 of 4)]
[im 1/54]
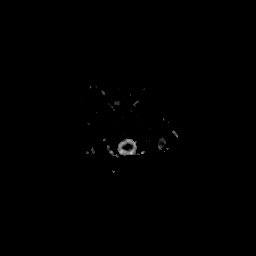
[im 11/54]
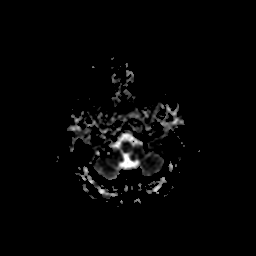
[im 22/54]
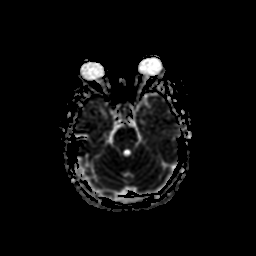
[im 32/54]
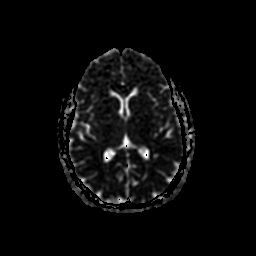
[im 43/54]
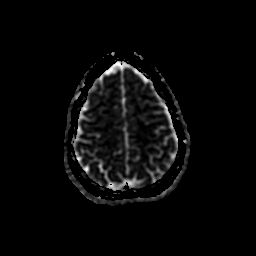
[im 54/54]
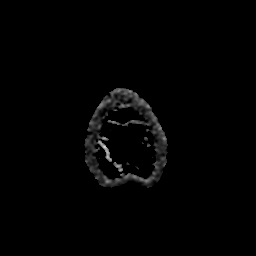

[Series 500: DWI · coronal · 4.0mm · 1.09mm/px · 4 of 35 slices shown (4 of 4)]
[im 1/35]
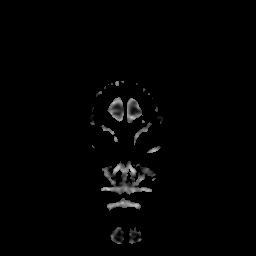
[im 12/35]
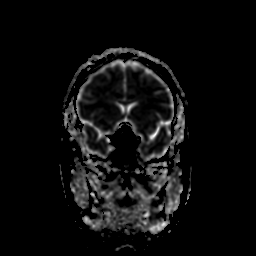
[im 23/35]
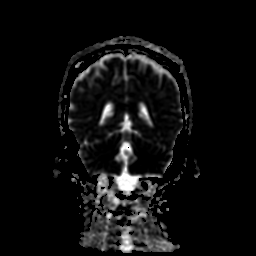
[im 35/35]
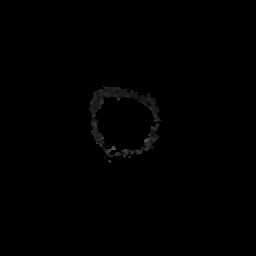

[36 of 48 positions shown; findings below may reference images not displayed]

FINDINGS: Brain: Cerebral volume within normal limits for patient age. No
focal parenchymal signal abnormality identified. No abnormal foci of
restricted diffusion to suggest acute or subacute ischemia.
Gray-white matter differentiation well maintained. No
encephalomalacia to suggest chronic infarction. No foci of
susceptibility artifact to suggest acute or chronic intracranial
hemorrhage.

Mass lesion, midline shift or mass effect. No hydrocephalus. No
extra-axial fluid collection. Major dural sinuses are grossly
patent.

Pituitary gland and suprasellar region are normal. Midline
structures intact and normal.

Vascular: Major intracranial vascular flow voids well maintained and
normal in appearance.

Skull and upper cervical spine: Craniocervical junction normal.
Visualized upper cervical spine within normal limits. Bone marrow
signal intensity normal. No scalp soft tissue abnormality.

Sinuses/Orbits: Globes and orbital soft tissues within normal
limits. Paranasal sinuses are clear. No mastoid effusion. Inner ear
structures normal.

Other: None.
IMPRESSION: Normal brain MRI.  No acute intracranial abnormality identified.
